# Patient Record
Sex: Female | Born: 1990 | Race: Black or African American | Hispanic: No | Smoking: Current every day smoker
Health system: Southern US, Community
[De-identification: ages and names within clinical notes are randomized; demographics above are authoritative.]

## PROBLEM LIST (undated history)

## (undated) DIAGNOSIS — J45909 Unspecified asthma, uncomplicated: Secondary | ICD-10-CM

## (undated) DIAGNOSIS — F419 Anxiety disorder, unspecified: Secondary | ICD-10-CM

## (undated) DIAGNOSIS — O009 Unspecified ectopic pregnancy without intrauterine pregnancy: Secondary | ICD-10-CM

## (undated) HISTORY — PX: ECTOPIC PREGNANCY SURGERY: SHX613

## (undated) HISTORY — DX: Anxiety disorder, unspecified: F41.9

---

## 2011-11-20 ENCOUNTER — Emergency Department: Payer: Self-pay | Admitting: Emergency Medicine

## 2011-12-31 ENCOUNTER — Emergency Department: Payer: Self-pay | Admitting: Emergency Medicine

## 2011-12-31 LAB — CBC
HCT: 38.1 % (ref 35.0–47.0)
HGB: 12.9 g/dL (ref 12.0–16.0)
MCH: 26.7 pg (ref 26.0–34.0)
MCHC: 33.7 g/dL (ref 32.0–36.0)
MCV: 79 fL — ABNORMAL LOW (ref 80–100)
RBC: 4.82 10*6/uL (ref 3.80–5.20)
RDW: 15.6 % — ABNORMAL HIGH (ref 11.5–14.5)

## 2011-12-31 LAB — URINALYSIS, COMPLETE
Bacteria: NONE SEEN
Bilirubin,UR: NEGATIVE
Glucose,UR: NEGATIVE mg/dL (ref 0–75)
Leukocyte Esterase: NEGATIVE
Nitrite: NEGATIVE
Specific Gravity: 1.002 (ref 1.003–1.030)
Squamous Epithelial: 1
WBC UR: 11 /HPF (ref 0–5)

## 2011-12-31 LAB — COMPREHENSIVE METABOLIC PANEL
Albumin: 3.8 g/dL (ref 3.4–5.0)
Anion Gap: 12 (ref 7–16)
Calcium, Total: 9 mg/dL (ref 8.5–10.1)
Chloride: 106 mmol/L (ref 98–107)
EGFR (African American): >60
Glucose: 74 mg/dL (ref 65–99)
Potassium: 3.7 mmol/L (ref 3.5–5.1)
SGOT(AST): 25 U/L (ref 15–37)
SGPT (ALT): 20 U/L (ref 12–78)

## 2011-12-31 LAB — LIPASE, BLOOD: Lipase: 105 U/L (ref 73–393)

## 2011-12-31 LAB — HCG, QUANTITATIVE, PREGNANCY: Beta Hcg, Quant.: 1578 m[IU]/mL — ABNORMAL HIGH

## 2011-12-31 LAB — WET PREP, GENITAL

## 2012-01-04 ENCOUNTER — Emergency Department: Payer: Self-pay | Admitting: Emergency Medicine

## 2012-01-04 LAB — HCG, QUANTITATIVE, PREGNANCY: Beta Hcg, Quant.: 1604 m[IU]/mL — ABNORMAL HIGH

## 2012-01-11 LAB — COMPREHENSIVE METABOLIC PANEL
Albumin: 4 g/dL (ref 3.4–5.0)
Alkaline Phosphatase: 85 U/L (ref 50–136)
BUN: 7 mg/dL (ref 7–18)
Bilirubin,Total: 0.3 mg/dL (ref 0.2–1.0)
Chloride: 105 mmol/L (ref 98–107)
Creatinine: 0.81 mg/dL (ref 0.60–1.30)
EGFR (Non-African Amer.): >60
Glucose: 131 mg/dL — ABNORMAL HIGH (ref 65–99)
Osmolality: 279 (ref 275–301)
SGPT (ALT): 19 U/L (ref 12–78)
Sodium: 140 mmol/L (ref 136–145)

## 2012-01-11 LAB — CBC
HCT: 38.4 % (ref 35.0–47.0)
HGB: 12.8 g/dL (ref 12.0–16.0)
MCH: 27.1 pg (ref 26.0–34.0)
MCHC: 33.3 g/dL (ref 32.0–36.0)

## 2012-01-11 LAB — URINALYSIS, COMPLETE
Bacteria: NONE SEEN
Glucose,UR: NEGATIVE mg/dL (ref 0–75)
Leukocyte Esterase: NEGATIVE
Nitrite: NEGATIVE
Protein: NEGATIVE
Squamous Epithelial: 2
WBC UR: 1 /HPF (ref 0–5)

## 2012-01-11 LAB — LIPASE, BLOOD: Lipase: 119 U/L (ref 73–393)

## 2012-01-12 ENCOUNTER — Observation Stay: Payer: Self-pay | Admitting: Obstetrics and Gynecology

## 2012-01-17 LAB — PATHOLOGY REPORT

## 2012-12-09 ENCOUNTER — Emergency Department: Payer: Self-pay | Admitting: Emergency Medicine

## 2013-04-22 ENCOUNTER — Emergency Department: Payer: Self-pay | Admitting: Emergency Medicine

## 2013-12-06 IMAGING — US US OB < 14 WEEKS - US OB TV
1 series · 14 of 28 positions shown · non-contrast
Comparison: none

REASON FOR EXAM: RLQ pain, vaginal bleeding, +hCG
COMMENTS:

PROCEDURE:     US  - US OB LESS THAN 14 WEEKS/W TRANS  - December 31, 2011 [DATE]
RESULT:     Comparison: None
TECHNIQUE: Multiple transabdominal gray-scale images and endovaginal
gray-scale images of the pelvis performed.

[Series 1: us ob < 14 weeks - us ob tv · 0.18mm/px · 14 of 82 slices shown]
[im 4/82]
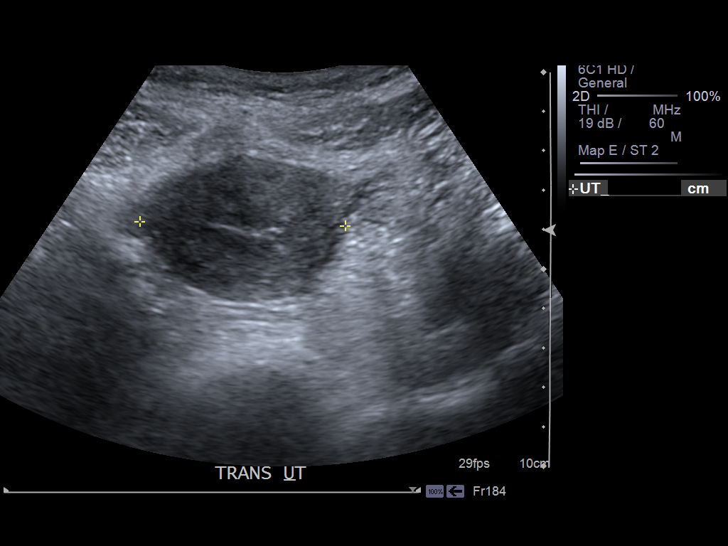
[im 10/82]
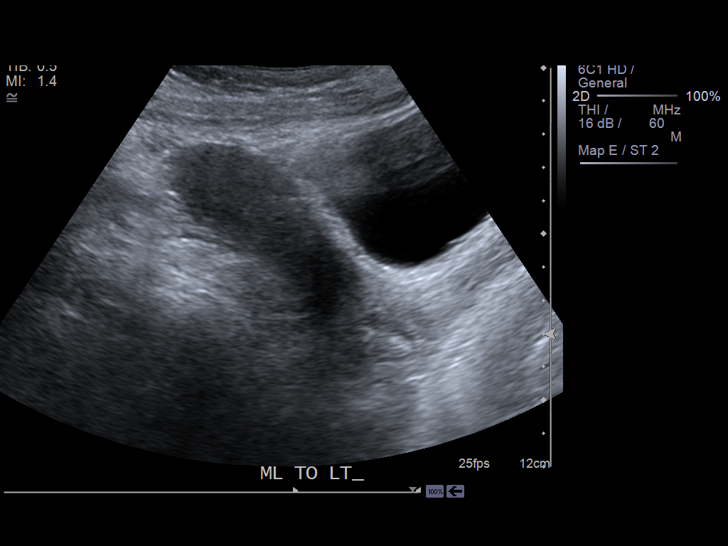
[im 16/82]
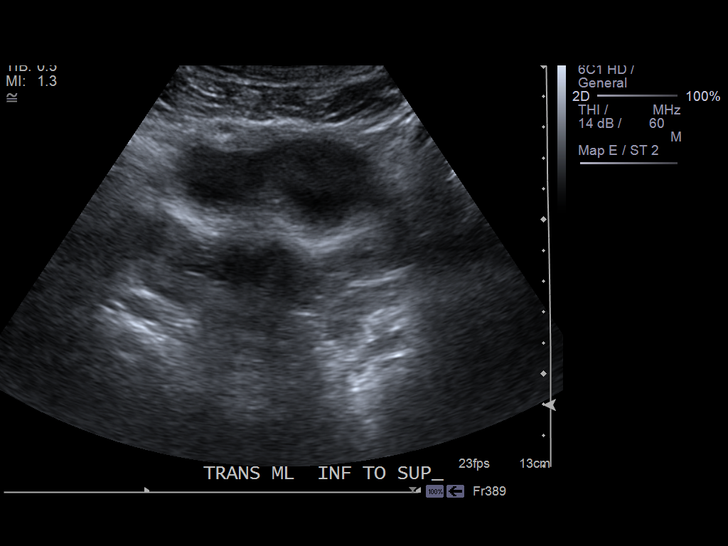
[im 22/82]
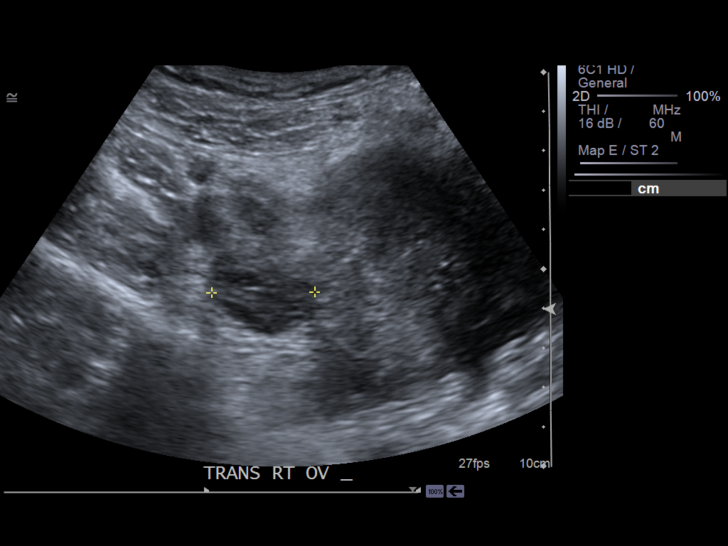
[im 28/82]
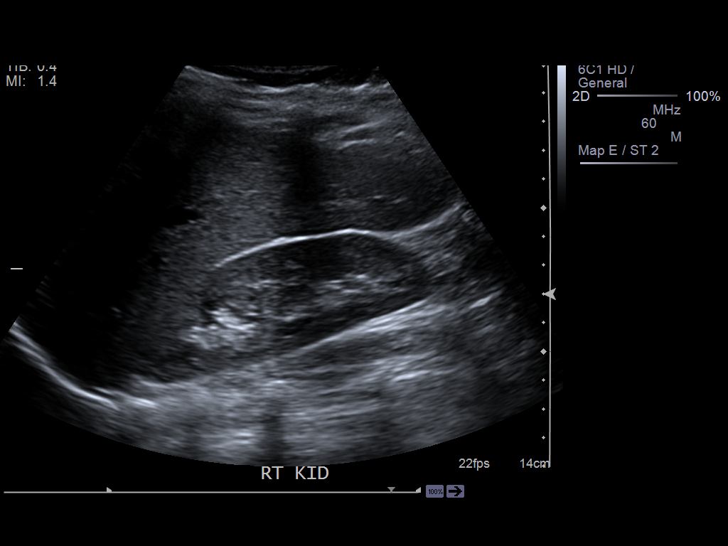
[im 34/82]
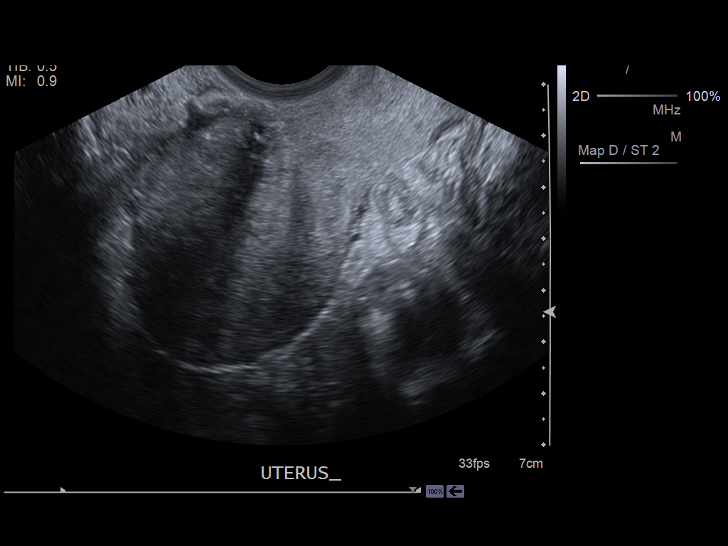
[im 40/82]
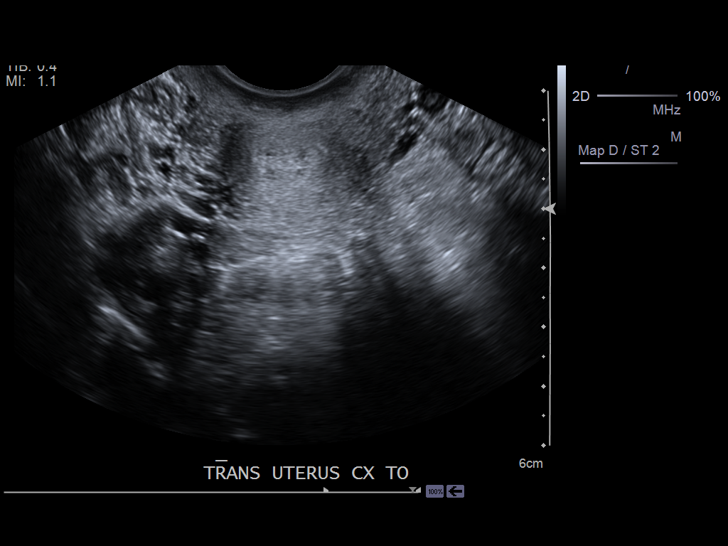
[im 46/82]
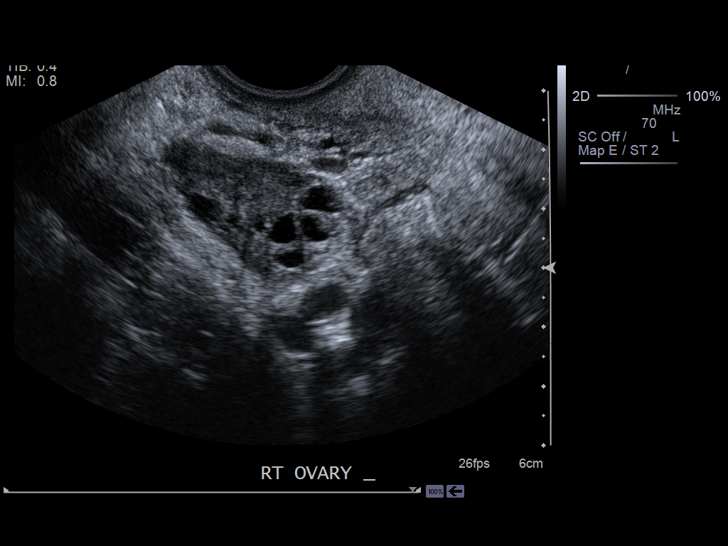
[im 52/82]
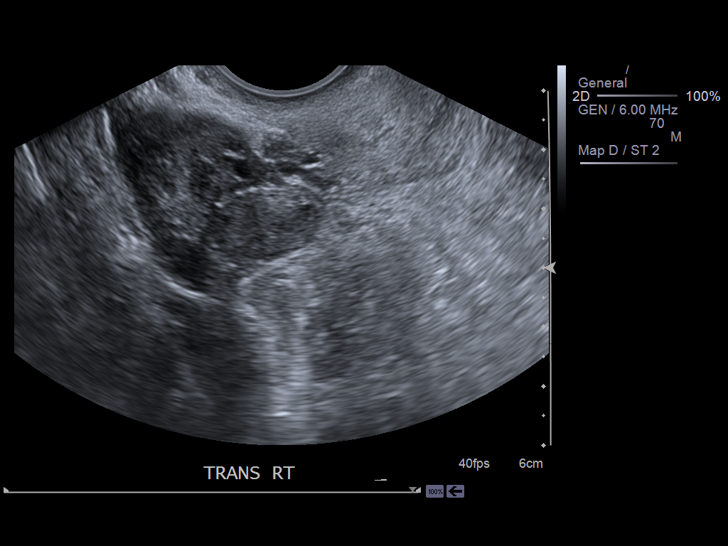
[im 58/82]
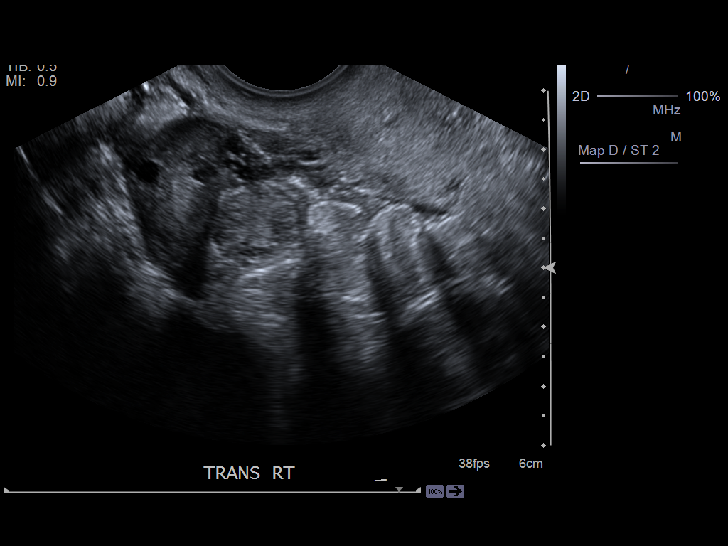
[im 64/82]
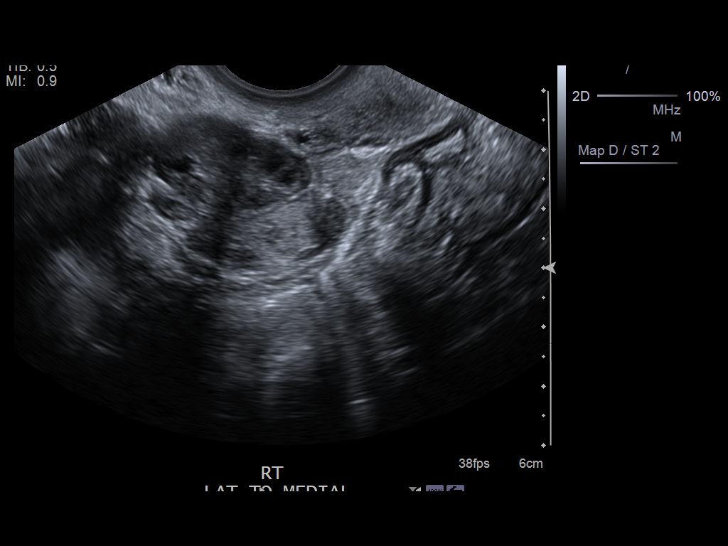
[im 70/82]
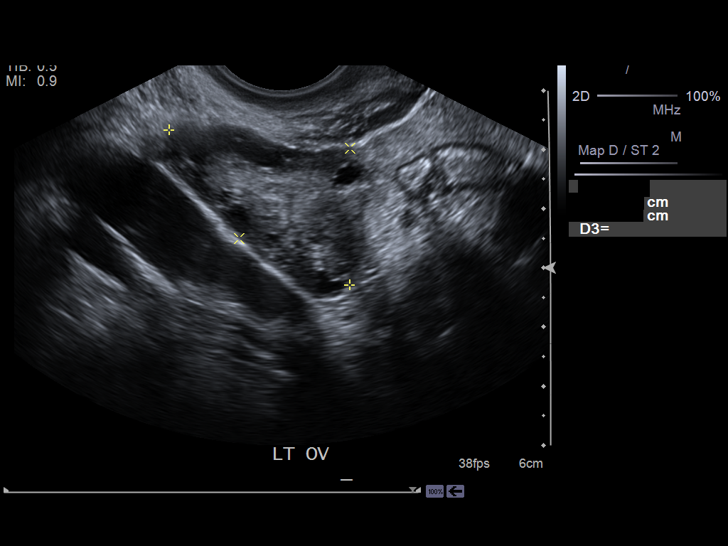
[im 76/82]
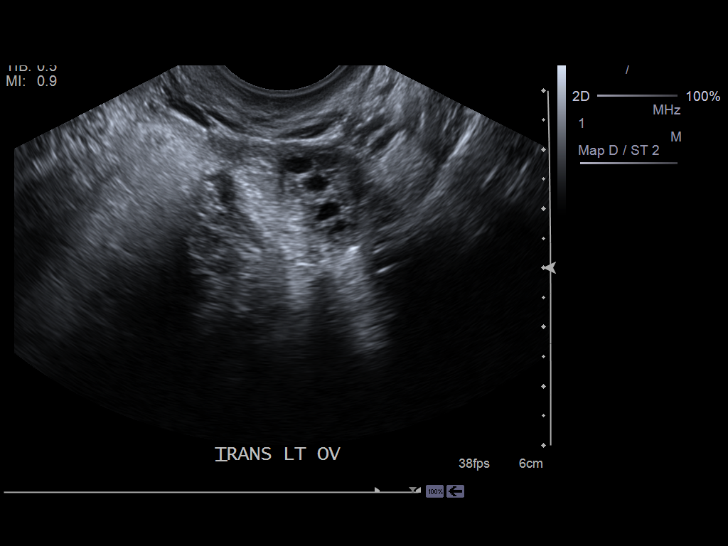
[im 82/82]
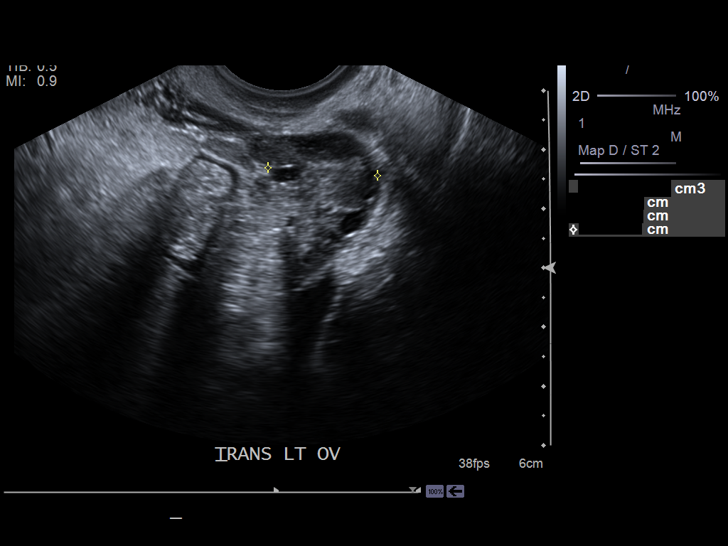

[14 of 28 positions shown; findings below may reference images not displayed]

FINDINGS: No intrauterine pregnancy is identified. The endometrium measures 8.8 mm in
thickness.

The right ovary measures 4 x 2.8 x 2.1 cm.  The left ovary measures 4 x
x 2.4 cm. There is a right adnexal mass measuring 2.6 x 1.8 x 1 cm.

There is a moderate amount of pelvic free fluid with debris within it.
IMPRESSION: No intrauterine pregnancy is identified. Differential diagnosis includes a
pregnancy too early to detect versus missed abortion versus ectopic
pregnancy. There is a hypoechoic right adnexal mass measuring 2.6 cm which
may represent a corpus luteum cyst versus ectopic pregnancy. There is mildly
complex fluid in the cul-de-sac. These findings raise the concern for an
ectopic pregnancy. Recommend clinical correlation, serial quantitative beta
HCGs, ectopic precautions, and followup ultrasound as clinically indicated.

[REDACTED]

## 2013-12-10 IMAGING — US US OB < 14 WEEKS - US OB TV
1 series · 13 of 28 positions shown · non-contrast
Comparison: none

REASON FOR EXAM: repeat ultrasound
COMMENTS:

PROCEDURE:     US  - US OB LESS THAN 14 WEEKS/W TRANS  - January 04, 2012 [DATE]
RESULT:      Comparison: None
TECHNIQUE: Multiple transabdominal gray-scale images and endovaginal
gray-scale images of the pelvis performed.

[Series 1: us ob < 14 weeks - us ob tv · 0.20mm/px · 13 of 61 slices shown]
[im 3/61]
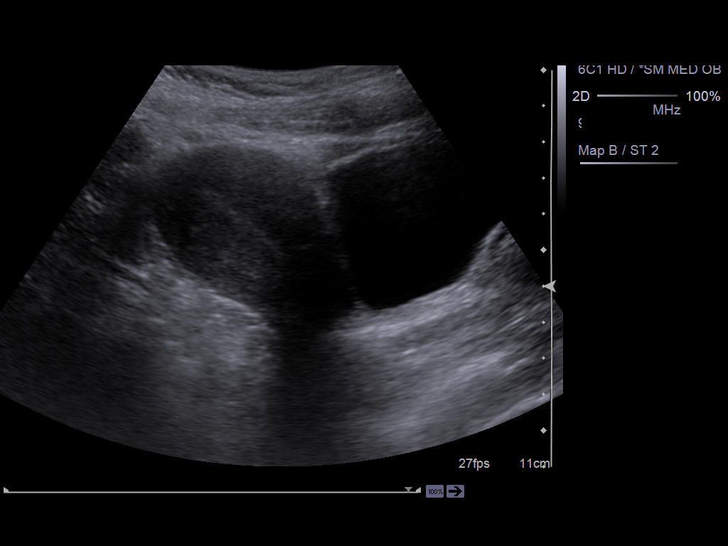
[im 7/61]
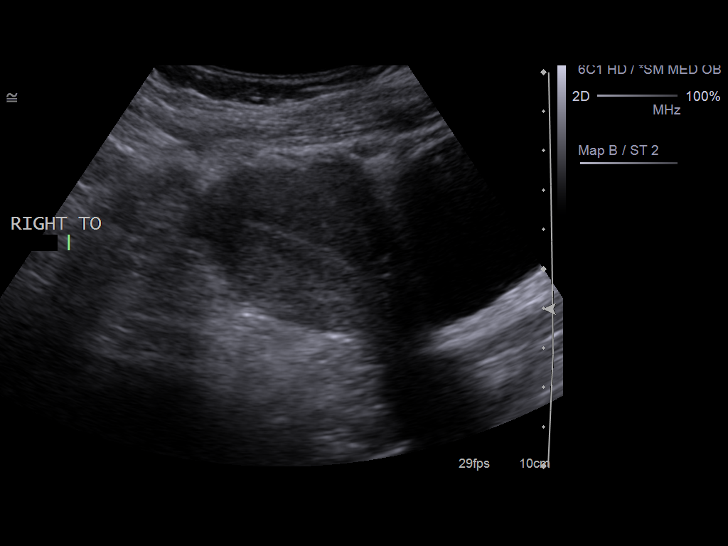
[im 12/61]
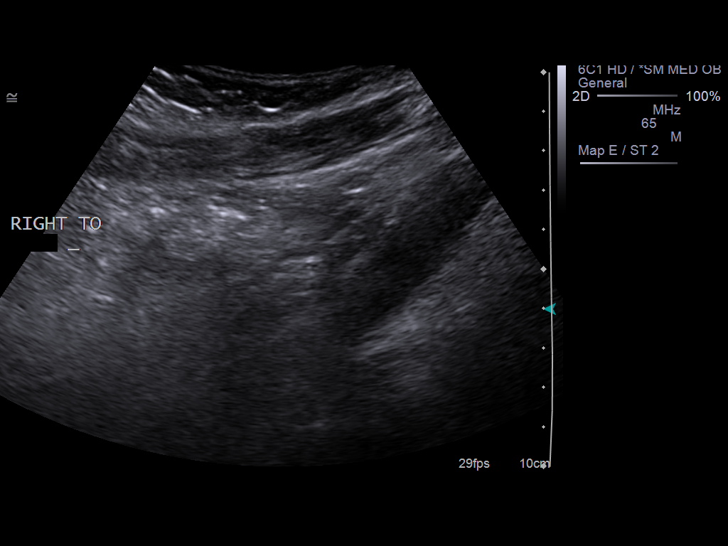
[im 16/61]
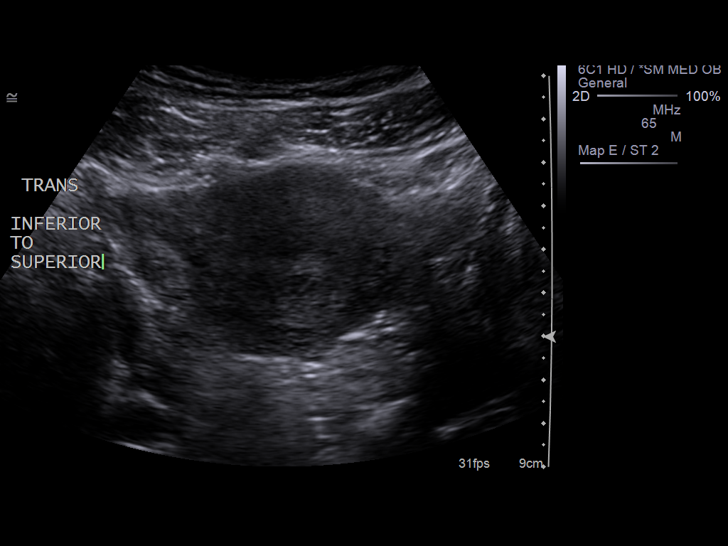
[im 21/61]
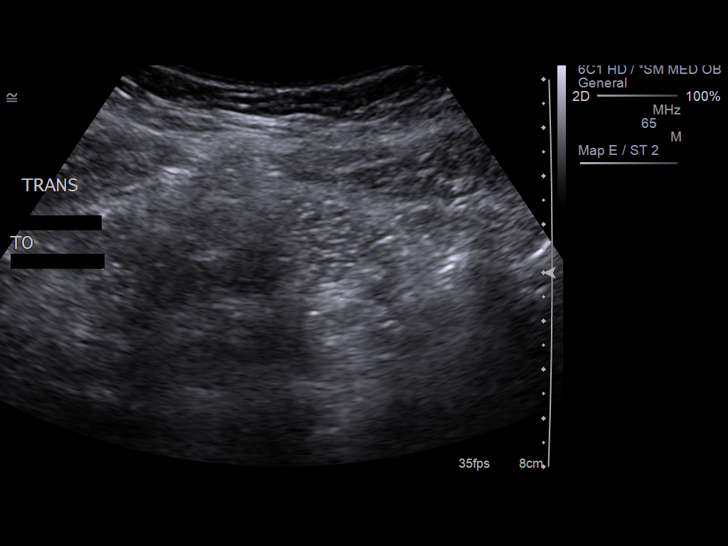
[im 25/61]
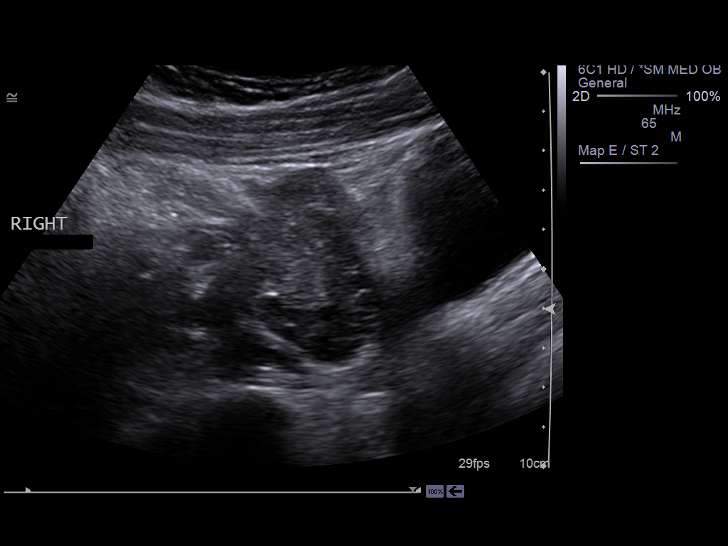
[im 32/61]
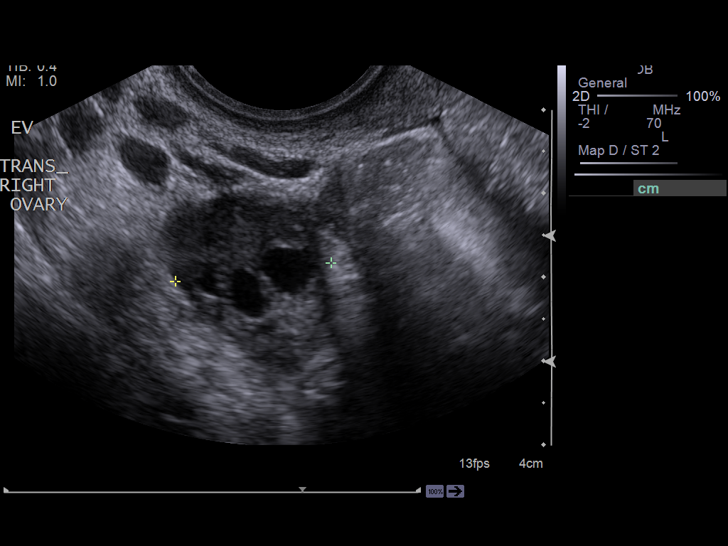
[im 36/61]
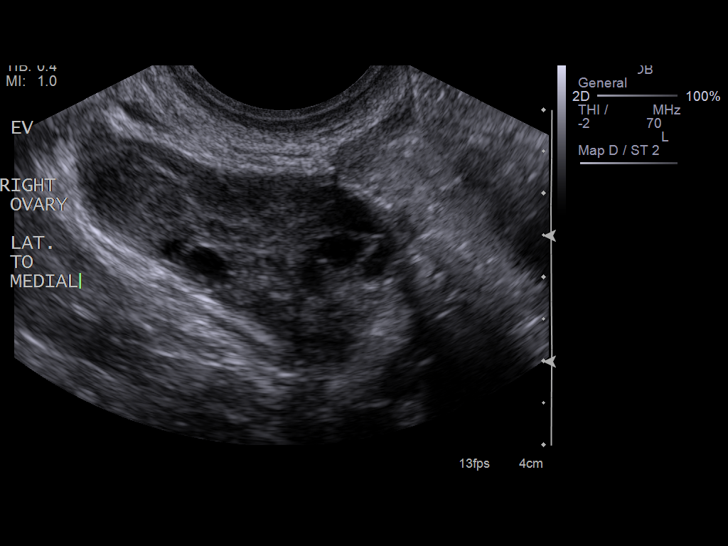
[im 41/61]
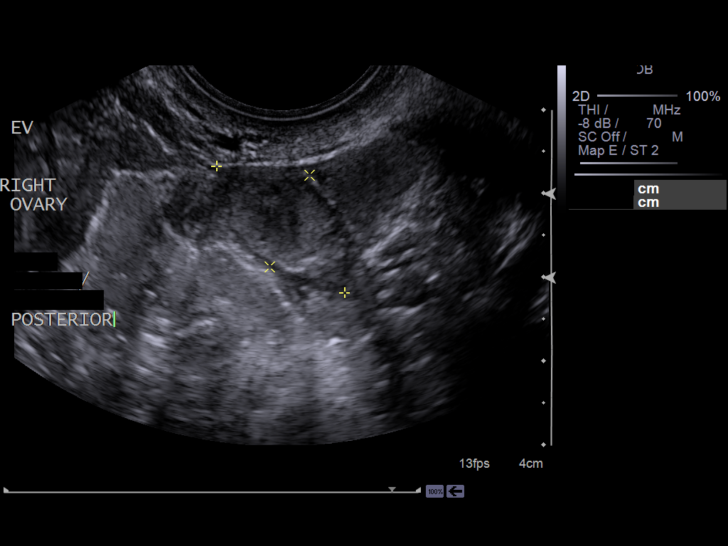
[im 45/61]
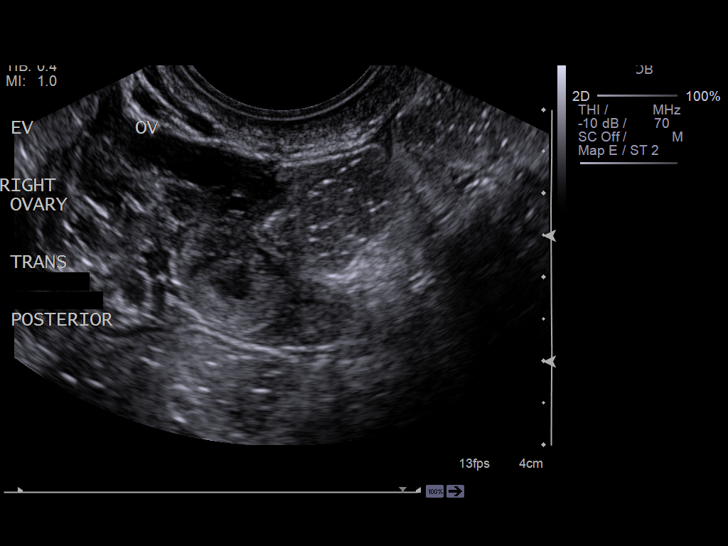
[im 49/61]
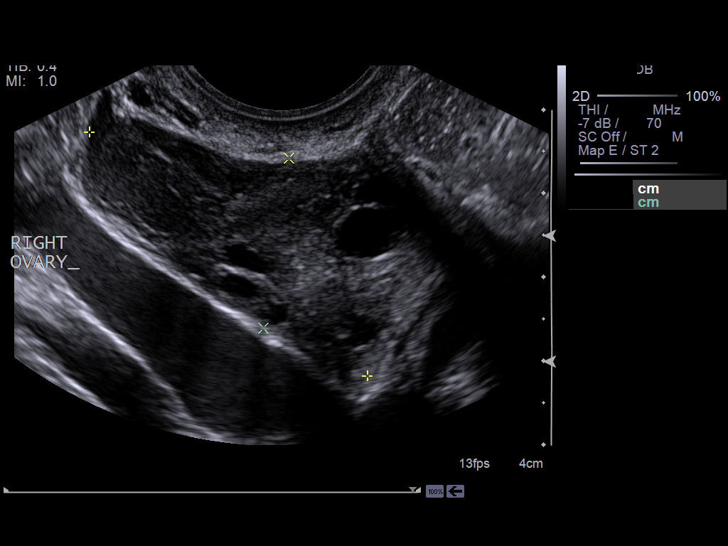
[im 54/61]
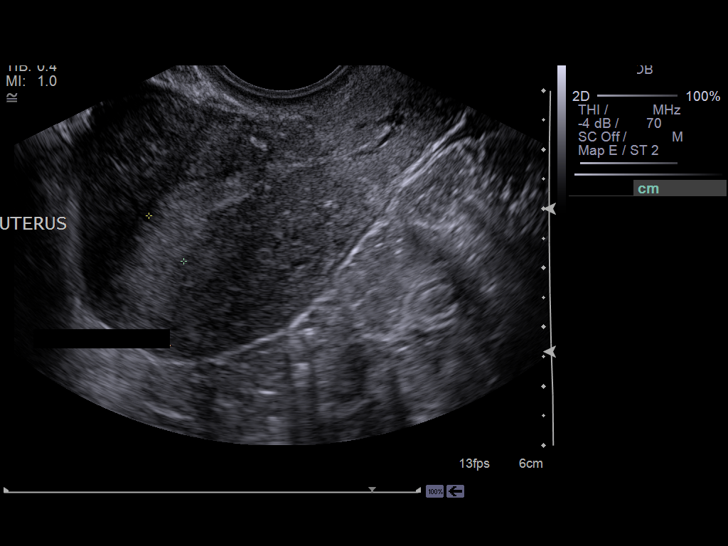
[im 58/61]
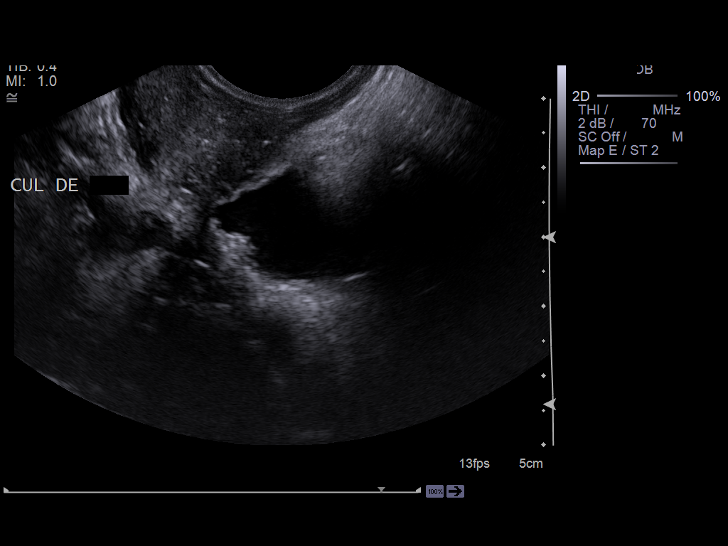

[13 of 28 positions shown; findings below may reference images not displayed]

FINDINGS: No intrauterine pregnancy is identified. The endometrium measures 9.7 mm in
thickness.

The right ovary measures 4.4 x 2.1 x 1.9 cm. The left ovary measures 4 x
x 2.4 cm. There is a right adnexal mass measuring 4.3 x 2.1 x 2.2 cm. There
is 2.2 x 1.2 x 1.4 cm right adnexal mass.

There is a moderate amount of pelvic free fluid with debris within it.
IMPRESSION: No intrauterine pregnancy is identified. Differential diagnosis includes a
pregnancy too early to detect versus missed abortion versus ectopic
pregnancy. There is a hypoechoic right adnexal mass measuring 2.2cm which
may represent a corpus luteum cyst versus ectopic pregnancy. There is mildly
complex fluid in the cul-de-sac. These findings raise the concern for an
ectopic pregnancy. Recommend clinical correlation, serial quantitative beta
HCGs, ectopic precautions, and followup ultrasound as clinically indicated.

[REDACTED]

## 2014-04-23 ENCOUNTER — Ambulatory Visit: Payer: Self-pay | Admitting: Obstetrics and Gynecology

## 2014-04-23 LAB — CBC
HCT: 39.4 % (ref 35.0–47.0)
HGB: 12.3 g/dL (ref 12.0–16.0)
MCH: 24.1 pg — AB (ref 26.0–34.0)
MCHC: 31.2 g/dL — AB (ref 32.0–36.0)
MCV: 77 fL — AB (ref 80–100)
Platelet: 123 10*3/uL — ABNORMAL LOW (ref 150–440)
RBC: 5.11 10*6/uL (ref 3.80–5.20)
RDW: 16.7 % — AB (ref 11.5–14.5)
WBC: 6.3 10*3/uL (ref 3.6–11.0)

## 2014-04-23 LAB — TSH: THYROID STIMULATING HORM: 0.704 u[IU]/mL

## 2014-04-27 ENCOUNTER — Ambulatory Visit: Payer: Self-pay | Admitting: Obstetrics and Gynecology

## 2014-07-05 ENCOUNTER — Emergency Department
Admit: 2014-07-05 | Disposition: A | Discharge status: Home / Self Care | Payer: Self-pay | Admitting: Physician Assistant

## 2014-07-21 NOTE — Op Note (Signed)
PATIENT NAME:  Heather [name], Heather [name] MR#:  [id number] DATE OF BIRTH:  February 20, 1991  DATE OF PROCEDURE:  01/12/2012  PREOPERATIVE DIAGNOSIS: Ectopic pregnancy.  POSTOPERATIVE DIAGNOSIS: Ruptured right fallopian tube and ectopic pregnancy with significant intraabdominal adhesive disease.  PROCEDURE: Laparoscopic lysis of adhesions and right salpingectomy.   SURGEON: Vena AustriaAndreas Rasheeda Mulvehill, MD  ANESTHESIA: General.   ESTIMATED BLOOD LOSS: 25 mL.  OPERATIVE FLUIDS: 1 liter of crystalloid and 50 mL of urine, straight catheter prior to beginning of the case.   COMPLICATIONS: None.   DRAINS OR TUBES: None.   IMPLANTS: None.   SPECIMENS REMOVED: Right tube containing ectopic pregnancy.   INTRAOPERATIVE FINDINGS: There was a significant amount of adhesive disease involving the omentum, anterior abdominal wall, right uterine fundus and cornea. These were taken down using a 5 mm LigaSure device. There was approximately 100 mL of hemoperitoneum noted upon entry into the abdomen. The right tube contained an ectopic pregnancy with active bleeding from the fimbria. The ectopic appeared to be involving the ampulla of the right tube. The left tube and ovary appeared grossly normal. The right ovary also appeared normal.   PATIENT CONDITION FOLLOWING PROCEDURE: Stable.   PROCEDURE IN DETAIL: Risks, benefits, and alternatives of the procedure were discussed with the patient prior to proceeding with the case. The patient had previously received methotrexate on 01/09/2012 without improvement in symptoms and actually worsening abdominal pain prompting her presentation to the Emergency Room. Ultrasound obtained in the Emergency Room revealed the previously noted right adnexal mass had increased in size with complex fluid in the pelvis suggestive of hemoperitoneum. The patient was taken back to the Operating Room were general anesthesia was administered. She was positioned in the dorsal lithotomy position using yellow-fin  stirrups. The patient's bladder was straight-catheterized and a sterile speculum was placed. The anterior lip of the cervix was visualized, grasped with a single-tooth tenaculum, and a Hulka tenaculum was then placed. Following this attention was turned to the patient's abdomen. The umbilicus was infiltrated with 1% lidocaine and a stab incision was made at the base of the umbilicus. A 5 mm XL trocar was used to gain entry into the abdomen under direct visualization. Once placement in the abdomen was confirmed, pneumoperitoneum was established. Two assistant ports, one 11 mm left lateral and one 5 mm right lateral, were placed under direct visualization. Significant adhesive disease was noted in the midline of the abdomen extending down to the uterine fundus involving the right cornea. This was mainly omentum. This was taken down using a 5 mm LigaSure device. Upon lysis of adhesions, the remainder of the abdomen was inspected and noted to be grossly normal, except for approximately 100 mL of hemoperitoneum. The left tube and ovary appeared grossly normal. The right tube contained an ectopic pregnancy, in the ampullary portion of the tube. The right ovary was normal. The right tube was removed using serial bites of the 5 mm LigaSure device. Upon transection of the specimen, it was placed into an Endo Catch bag and removed through the 11 mm port site. Pedicles were inspected and noted to be hemostatic, all trocars were removed under direct visualization and pneumoperitoneum was evacuated. The 11 mm port site was closed using a #1 Vicryl on a GU needle. The skin was closed, on the 11 mm port site, using 4-0 Monocryl in subcuticular fashion. Each port site was then dressed with Dermabond. Sponge, needle, and instrument counts were correct x2. The patient tolerated the procedure well and was taken to the  recovery room in stable condition. ____________________________ Florina Ou. Bonney Aid, MD ams:slb D: 01/16/2012  13:10:52 ET     T: 01/16/2012 13:30:34 ET        JOB#: [id number] cc: Florina Ou. Bonney Aid, MD, <Dictator> [place] Cathrine Muster MD ELECTRONICALLY SIGNED 01/22/2012 21:35
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
## 2014-07-27 LAB — SURGICAL PATHOLOGY

## 2014-08-02 NOTE — Op Note (Signed)
PATIENT NAME:  Heather [name], Heather [name] MR#:  [id number] DATE OF BIRTH:  [birth year]  DATE OF PROCEDURE:  04/27/2014  PREOPERATIVE DIAGNOSES: Positive ECC, cells suspicious for HGSIL.   POSTOPERATIVE DIAGNOSES: Positive ECC, cells suspicious for HGSIL.   OPERATIVE PROCEDURE: Loop electrosurgical excision procedure cone biopsy.   SURGEON: Prentice DockerMartin A. Marieme Mcmackin, MD  FIRST ASSISTANT: Mare LoanJan Beard, PA-S.   ANESTHESIA: General.   INDICATIONS: The patient is a 24 year old African American female para 1-0-2-1, who presents for surgical management of endocervical dysplasia. Colposcopy demonstrated no ectocervical disease. 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   FINDINGS AT SURGERY: Included non-staining area of Lugol's solution at 1:00.   DESCRIPTION OF PROCEDURE: The patient was brought to the operating room where she was placed in the supine position. General anesthesia was induced without difficulty. She was placed in dorsal lithotomy position using the candy cane stirrups. A Betadine external prep and drape was performed in standard fashion. A coated speculum was placed into the vagina and the vagina was painted with Lugol solution. Non-staining area of Lugol's was noted at 1:00. Cervix was circumferentially infiltrated with 1% lidocaine with 1:100,000 epinephrine in standard fashion. Approximately 15 mL were utilized. The ectocervical LEEP was then performed with a wide loop. The endocervical LEEP was performed with a narrow loop. The post LEEP ECC was performed with a serrated curette. Rollerball cautery was used on the cone bed to help facilitate hemostasis. Astringent was applied to the cone bed. The patient's bladder was then drained with a red Robinson catheter producing 250 mL of yellow urine. The patient was then awakened, mobilized, and taken to the recovery room in satisfactory condition.   ESTIMATED BLOOD LOSS: Minimal.   COMPLICATIONS: None.    All instruments, needle, and sponge  counts were verified as correct.     ____________________________ Prentice DockerMartin A. Blayden Conwell, MD mad:ap D: 04/27/2014 10:48:36 ET T: 04/27/2014 11:28:08 ET JOB#: [id number]  cc: Daphine DeutscherMartin A. Lovell Roe, MD, <Dictator> [facility] Prentice DockerMARTIN A Audie Wieser MD ELECTRONICALLY SIGNED 04/29/2014 1:20

## 2014-08-11 NOTE — H&P (Signed)
L&D Evaluation:  History:  HPI [id number], on OCP's for Contraception presents for Leep Cone Biopsy for +ECC suspicious for HGSIL   Patient's Medical History No Chronic Illness   Patient's Surgical History Previous C-Section  Rt Salpingectomy   Medications Chantix, Claritin   Social History tobacco   Family History Non-Contributory   ROS:  ROS All systems were reviewed.  HEENT, CNS, GI, GU, Respiratory, CV, Renal and Musculoskeletal systems were found to be normal.   Exam:  Vital Signs stable   General no apparent distress   Mental Status clear   Chest clear   Heart normal sinus rhythm   Back no CVAT   Edema no edema   Pelvic Deferred to OR   Skin dry   Lymph no lymphadenopathy   Impression:  Impression +ECC for HGSIL (suspicion); Colpo negative   Plan:  Plan LEEP Procedure   Comments Pre Op Counseling: Full counseling done. Pt accepts all risks of surgery including bleeding/infection/anesthesia risks/etc.All questions addressed. Consent given.   SEE FAXED H&P FOR DETAILS.   Electronic Signatures: Toniesha Zellner, Prentice DockerMartin A (MD)  (Signed 25-Jan-16 08:57)  Authored: L&D Evaluation   Last Updated: 25-Jan-16 08:57 by Cindy Brindisi, Prentice DockerMartin A (MD)

## 2016-06-23 ENCOUNTER — Ambulatory Visit
Admission: EM | Admit: 2016-06-23 | Discharge: 2016-06-23 | Disposition: A | Discharge status: Home / Self Care | Payer: Self-pay | Attending: Family Medicine | Admitting: Family Medicine

## 2016-06-23 DIAGNOSIS — J209 Acute bronchitis, unspecified: Secondary | ICD-10-CM

## 2016-06-23 DIAGNOSIS — R059 Cough, unspecified: Secondary | ICD-10-CM

## 2016-06-23 DIAGNOSIS — R05 Cough: Secondary | ICD-10-CM

## 2016-06-23 MED ORDER — HYDROCOD POLST-CPM POLST ER 10-8 MG/5ML PO SUER: 5.0000 mL | Freq: Two times a day (BID) | ORAL | 0 refills | Status: DC | PRN

## 2016-06-23 MED ORDER — ALBUTEROL SULFATE HFA 108 (90 BASE) MCG/ACT IN AERS: 2.0000 | INHALATION_SPRAY | RESPIRATORY_TRACT | 0 refills | Status: AC | PRN

## 2016-06-23 MED ORDER — AZITHROMYCIN 250 MG PO TABS: ORAL_TABLET | ORAL | 0 refills | Status: DC

## 2016-06-23 NOTE — ED Triage Notes (Signed)
Pt reports 2 weeks of cough, worse at night. Denies other sx currently. Denies pain. Denies fever. Reports the last time she had this, she needed an inhaler. Asking for another inhaler.

## 2016-06-23 NOTE — ED Provider Notes (Signed)
[facility]    CSN: [id number] Arrival date & time: 06/23/16  1329     History   Chief Complaint Chief Complaint  Patient presents with  . Cough    HPI Heather [name] is a 26 y.o. female.   Patient is a 26 year old black female who has had episodes of bronchospasm and wheezing off and on over the last 2 weeks.. She reports using albuterol inhaler before and feels that she needs an inhaler now. She reports coughing up yellowish-green sputum at times and proximal spasms as well especially at night. Past smoker history she's had an ectopic pregnancy surgery. She still smokes. No known drug allergies. No pertinent family medical history.   The history is provided by the patient. No language interpreter was used.  Cough  Cough characteristics:  Productive and non-productive Sputum characteristics:  Yellow and green Severity:  Moderate Duration:  2 weeks Timing:  Intermittent Progression:  Worsening Chronicity:  New Smoker: yes   Context: upper respiratory infection   Relieved by:  Nothing Worsened by:  Nothing Ineffective treatments:  None tried Associated symptoms: shortness of breath and wheezing   Associated symptoms: no chills, no diaphoresis and no fever     History reviewed. No pertinent past medical history.  There are no active problems to display for this patient.   Past Surgical History:  Procedure Laterality Date  . ECTOPIC PREGNANCY SURGERY      OB History    No data available       Home Medications    Prior to Admission medications   Medication Sig Start Date End Date Taking? Authorizing Provider  guaiFENesin (MUCINEX) 600 MG 12 hr tablet Take by mouth 2 (two) times daily.   Yes Historical Provider, MD  albuterol (PROVENTIL HFA;VENTOLIN HFA) 108 (90 Base) MCG/ACT inhaler Inhale 2 puffs into the lungs every 4 (four) hours as needed for wheezing or shortness of breath. 06/23/16   Hassan Rowan, MD  azithromycin (ZITHROMAX Z-PAK) 250 MG  tablet Take 2 tablets first day and then 1 po a day for 4 days 06/23/16   Hassan Rowan, MD  chlorpheniramine-HYDROcodone [facility] PENNKINETIC ER) 10-8 MG/5ML SUER Take 5 mLs by mouth every 12 (twelve) hours as needed. 06/23/16   Hassan Rowan, MD    Family History History reviewed. No pertinent family history.  Social History Social History  Substance Use Topics  . Smoking status: Current Every Day Smoker    Types: Cigars  . Smokeless tobacco: Never Used  . Alcohol use Yes     Comment: social     Allergies   Patient has no known allergies.   Review of Systems Review of Systems  Constitutional: Negative for chills, diaphoresis and fever.  Respiratory: Positive for cough, shortness of breath and wheezing.   All other systems reviewed and are negative.    Physical Exam Triage Vital Signs ED Triage Vitals  Enc Vitals Group     BP 06/23/16 1344 123/72     Pulse Rate 06/23/16 1344 82     Resp 06/23/16 1344 16     Temp 06/23/16 1344 98.8 F (37.1 C)     Temp Source 06/23/16 1344 Oral     SpO2 06/23/16 1344 100 %     Weight 06/23/16 1343 150 lb (68 kg)     Height 06/23/16 1343 5\' 2"  (1.575 m)     Head Circumference --      Peak Flow --      Pain Score --  Pain Loc --      Pain Edu? --      Excl. in GC? --    No data found.   Updated Vital Signs BP 123/72 (BP Location: Right Arm)   Pulse 82   Temp 98.8 F (37.1 C) (Oral)   Resp 16   Ht 5\' 2"  (1.575 m)   Wt 150 lb (68 kg)   LMP 06/22/2016   SpO2 100%   BMI 27.44 kg/m   Visual Acuity Right Eye Distance:   Left Eye Distance:   Bilateral Distance:    Right Eye Near:   Left Eye Near:    Bilateral Near:     Physical Exam  Constitutional: She appears well-developed and well-nourished. No distress.  HENT:  Head: Normocephalic and atraumatic.  Right Ear: External ear normal.  Left Ear: External ear normal.  Nose: Nose normal.  Mouth/Throat: Oropharynx is clear and moist.  Eyes: Pupils are equal,  round, and reactive to light.  Neck: Normal range of motion.  Cardiovascular: Normal rate and regular rhythm.   Pulmonary/Chest: Effort normal and breath sounds normal.  Musculoskeletal: Normal range of motion.  Lymphadenopathy:    She has cervical adenopathy.  Neurological: She is alert.  Skin: Skin is warm. She is not diaphoretic.  Psychiatric: She has a normal mood and affect.  Vitals reviewed.    UC Treatments / Results  Labs (all labs ordered are listed, but only abnormal results are displayed) Labs Reviewed - No data to display  EKG  EKG Interpretation None       Radiology No results found.  Procedures Procedures (including critical care time)  Medications Ordered in UC Medications - No data to display   Initial Impression / Assessment and Plan / UC Course  I have reviewed the triage vital signs and the nursing notes.  Pertinent labs & imaging results that were available during my care of the patient were reviewed by me and considered in my medical decision making (see chart for details).     Since the history is shortness breath for 2 weeks she used inhaler before we'll renew her albuterol inhaler Z-Pak test next 1 teaspoon twice a day when necessary for cough follow-up PCP in a week if not better. She declined a work note.  Final Clinical Impressions(s) / UC Diagnoses   Final diagnoses:  Acute bronchitis with bronchospasm  Cough    New Prescriptions Discharge Medication List as of 06/23/2016  2:16 PM    START taking these medications   Details  albuterol (PROVENTIL HFA;VENTOLIN HFA) 108 (90 Base) MCG/ACT inhaler Inhale 2 puffs into the lungs every 4 (four) hours as needed for wheezing or shortness of breath., Starting Fri 06/23/2016, Normal    azithromycin (ZITHROMAX Z-PAK) 250 MG tablet Take 2 tablets first day and then 1 po a day for 4 days, Normal    chlorpheniramine-HYDROcodone (TUSSIONEX PENNKINETIC ER) 10-8 MG/5ML SUER Take 5 mLs by mouth every  12 (twelve) hours as needed., Starting Fri 06/23/2016, Normal         Note: This dictation was prepared with Dragon dictation along with smaller phrase technology. Any transcriptional errors that result from this process are unintentional.   Hassan RowanEugene Emillio Ngo, MD 06/23/16 1453

## 2016-10-20 ENCOUNTER — Telehealth: Payer: Self-pay | Admitting: Obstetrics and Gynecology

## 2016-10-20 NOTE — Telephone Encounter (Signed)
Patient received your message - please call her again

## 2016-10-26 NOTE — Telephone Encounter (Signed)
I contacted pt. She did not have much info. Advised I contact her Mother Champ MungoGiselle but mailbox full. Will try later.   Per PT they had to r[place]t [place] HSG. This form is for travel guard insurance. They would like to get reimbursed for the ticket. Per pt she could only have  hsg on 04/23/2014.

## 2016-10-27 NOTE — Telephone Encounter (Signed)
Tried to contact Mom(Heather [name]) mailbox full.

## 2016-11-09 NOTE — Telephone Encounter (Signed)
Pts mom Giselle aware ac has signed travel guard insurance. Faxed with confirmation and left up front for p/u.

## 2019-05-16 ENCOUNTER — Ambulatory Visit: Payer: Medicaid Other | Attending: Internal Medicine

## 2019-05-16 DIAGNOSIS — Z20822 Contact with and (suspected) exposure to covid-19: Secondary | ICD-10-CM

## 2019-05-17 LAB — NOVEL CORONAVIRUS, NAA: SARS-CoV-2, NAA: NOT DETECTED

## 2019-08-11 ENCOUNTER — Encounter: Payer: Self-pay | Admitting: Certified Nurse Midwife

## 2019-08-12 ENCOUNTER — Telehealth: Payer: Self-pay | Admitting: Certified Nurse Midwife

## 2019-08-12 NOTE — Telephone Encounter (Signed)
Patient called in to reschedule her appointment that she had to cancelled on May 10th at 2:15pm. Went to reschedule patient for NEW GYN visit type but [facility] didn't have any avalibility until the first week of June. Patient said that she didn't understand why she was being considered a new patient as she has been seen at our facility before, informed patient that she hadn't been seen in over 3 years so we would consider her a NEW GYN. Before I had the chance to offer another provider or even ask JML if we could work her in she hung up on me.

## 2019-08-28 ENCOUNTER — Ambulatory Visit: Payer: 59 | Admitting: Obstetrics and Gynecology

## 2019-09-18 ENCOUNTER — Telehealth: Payer: Self-pay | Admitting: Obstetrics and Gynecology

## 2019-09-18 ENCOUNTER — Encounter: Payer: Medicaid Other | Admitting: Obstetrics and Gynecology

## 2019-09-18 NOTE — Telephone Encounter (Signed)
Pt got here at 2:43 for her appt. I told the pt that she was late and we have to call back to see if they are still going to see her. I told the pt to have a seat and I called back to the nurse. The nurse stated that the pt is almost 15 mins past her appt and we will have to reschedule. I called the pt back to the window and that the pt what the nurse stated that we will have to reschdule. The pt seemed okay and stated that she is off next Friday. I didn't have any open apps for her with that provider. The pt stated she can see someone else. I then looked at all our providers to see what is the next appt open. I told her I had one on the 29th and she stated she has to work. Gave her a couple of more days that we have and she said she would just give [place] a call because every day that I said we have she has to work. I told the pt I could help her now but hse just walked out. I told her I was sorry.

## 2019-10-03 ENCOUNTER — Ambulatory Visit: Payer: 59 | Admitting: Obstetrics

## 2019-10-09 ENCOUNTER — Other Ambulatory Visit (HOSPITAL_COMMUNITY)
Admission: RE | Admit: 2019-10-09 | Discharge: 2019-10-09 | Disposition: A | Discharge status: Home / Self Care | Payer: 59 | Source: Ambulatory Visit | Attending: Advanced Practice Midwife | Admitting: Advanced Practice Midwife

## 2019-10-09 ENCOUNTER — Other Ambulatory Visit: Payer: Self-pay

## 2019-10-09 ENCOUNTER — Ambulatory Visit: Payer: 59 | Admitting: Advanced Practice Midwife

## 2019-10-09 ENCOUNTER — Encounter: Payer: Self-pay | Admitting: Advanced Practice Midwife

## 2019-10-09 VITALS — BP 118/70 | HR 81 | Ht 62.0 in | Wt 171.0 lb

## 2019-10-09 DIAGNOSIS — Z113 Encounter for screening for infections with a predominantly sexual mode of transmission: Secondary | ICD-10-CM | POA: Insufficient documentation

## 2019-10-09 DIAGNOSIS — Z124 Encounter for screening for malignant neoplasm of cervix: Secondary | ICD-10-CM | POA: Diagnosis present

## 2019-10-09 DIAGNOSIS — Z01419 Encounter for gynecological examination (general) (routine) without abnormal findings: Secondary | ICD-10-CM | POA: Diagnosis not present

## 2019-10-09 DIAGNOSIS — B009 Herpesviral infection, unspecified: Secondary | ICD-10-CM

## 2019-10-09 NOTE — Patient Instructions (Signed)
Health Maintenance, Female Adopting a healthy lifestyle and getting preventive care are important in promoting health and wellness. Ask your health care provider about:  The right schedule for you to have regular tests and exams.  Things you can do on your own to prevent diseases and keep yourself healthy. What should I know about diet, weight, and exercise? Eat a healthy diet   Eat a diet that includes plenty of vegetables, fruits, low-fat dairy products, and lean protein.  Do not eat a lot of foods that are high in solid fats, added sugars, or sodium. Maintain a healthy weight Body mass index (BMI) is used to identify weight problems. It estimates body fat based on height and weight. Your health care provider can help determine your BMI and help you achieve or maintain a healthy weight. Get regular exercise Get regular exercise. This is one of the most important things you can do for your health. Most adults should:  Exercise for at least 150 minutes each week. The exercise should increase your heart rate and make you sweat (moderate-intensity exercise).  Do strengthening exercises at least twice a week. This is in addition to the moderate-intensity exercise.  Spend less time sitting. Even light physical activity can be beneficial. Watch cholesterol and blood lipids Have your blood tested for lipids and cholesterol at 31 years of age, then have this test every 5 years. Have your cholesterol levels checked more often if:  Your lipid or cholesterol levels are high.  You are older than 31 years of age.  You are at high risk for heart disease. What should I know about cancer screening? Depending on your health history and family history, you may need to have cancer screening at various ages. This may include screening for:  Breast cancer.  Cervical cancer.  Colorectal cancer.  Skin cancer.  Lung cancer. What should I know about heart disease, diabetes, and high blood  pressure? Blood pressure and heart disease  High blood pressure causes heart disease and increases the risk of stroke. This is more likely to develop in people who have high blood pressure readings, are of African descent, or are overweight.  Have your blood pressure checked: ? Every 3-5 years if you are 18-39 years of age. ? Every year if you are 40 years old or older. Diabetes Have regular diabetes screenings. This checks your fasting blood sugar level. Have the screening done:  Once every three years after age 40 if you are at a normal weight and have a low risk for diabetes.  More often and at a younger age if you are overweight or have a high risk for diabetes. What should I know about preventing infection? Hepatitis B If you have a higher risk for hepatitis B, you should be screened for this virus. Talk with your health care provider to find out if you are at risk for hepatitis B infection. Hepatitis C Testing is recommended for:  Everyone born from 1945 through 1965.  Anyone with known risk factors for hepatitis C. Sexually transmitted infections (STIs)  Get screened for STIs, including gonorrhea and chlamydia, if: ? You are sexually active and are younger than 31 years of age. ? You are older than 31 years of age and your health care provider tells you that you are at risk for this type of infection. ? Your sexual activity has changed since you were last screened, and you are at increased risk for chlamydia or gonorrhea. Ask your health care provider if   you are at risk.  Ask your health care provider about whether you are at high risk for HIV. Your health care provider may recommend a prescription medicine to help prevent HIV infection. If you choose to take medicine to prevent HIV, you should first get tested for HIV. You should then be tested every 3 months for as long as you are taking the medicine. Pregnancy  If you are about to stop having your period (premenopausal) and  you may become pregnant, seek counseling before you get pregnant.  Take 400 to 800 micrograms (mcg) of folic acid every day if you become pregnant.  Ask for birth control (contraception) if you want to prevent pregnancy. Osteoporosis and menopause Osteoporosis is a disease in which the bones lose minerals and strength with aging. This can result in bone fractures. If you are 65 years old or older, or if you are at risk for osteoporosis and fractures, ask your health care provider if you should:  Be screened for bone loss.  Take a calcium or vitamin D supplement to lower your risk of fractures.  Be given hormone replacement therapy (HRT) to treat symptoms of menopause. Follow these instructions at home: Lifestyle  Do not use any products that contain nicotine or tobacco, such as cigarettes, e-cigarettes, and chewing tobacco. If you need help quitting, ask your health care provider.  Do not use street drugs.  Do not share needles.  Ask your health care provider for help if you need support or information about quitting drugs. Alcohol use  Do not drink alcohol if: ? Your health care provider tells you not to drink. ? You are pregnant, may be pregnant, or are planning to become pregnant.  If you drink alcohol: ? Limit how much you use to 0-1 drink a day. ? Limit intake if you are breastfeeding.  Be aware of how much alcohol is in your drink. In the [place] one drink equals one 12 oz bottle of beer (355 mL), one 5 oz glass of wine (148 mL), or one 1 oz glass of hard liquor (44 mL). General instructions  Schedule regular health, dental, and eye exams.  Stay current with your vaccines.  Tell your health care provider if: ? You often feel depressed. ? You have ever been abused or do not feel safe at home. Summary  Adopting a healthy lifestyle and getting preventive care are important in promoting health and wellness.  Follow your health care provider's instructions about healthy  diet, exercising, and getting tested or screened for diseases.  Follow your health care provider's instructions on monitoring your cholesterol and blood pressure. This information is not intended to replace advice given to you by your health care provider. Make sure you discuss any questions you have with your health care provider. Document Revised: 03/13/2018 Document Reviewed: 03/13/2018 Elsevier Patient Education  2020 Elsevier Inc.  

## 2019-10-10 ENCOUNTER — Encounter: Payer: Self-pay | Admitting: Advanced Practice Midwife

## 2019-10-10 LAB — HSV(HERPES SIMPLEX VRS) I + II AB-IGG
HSV 1 Glycoprotein G Ab, IgG: 45.7 index — ABNORMAL HIGH (ref 0.00–0.90)
HSV 2 IgG, Type Spec: <0.91 index (ref 0.00–0.90)

## 2019-10-10 LAB — HEPATITIS PANEL, ACUTE
Hep A IgM: NEGATIVE
Hep B C IgM: NEGATIVE
Hep C Virus Ab: <0.1 s/co ratio (ref 0.0–0.9)
Hepatitis B Surface Ag: NEGATIVE

## 2019-10-10 LAB — RPR QUALITATIVE: RPR Ser Ql: NONREACTIVE

## 2019-10-10 LAB — HIV ANTIBODY (ROUTINE TESTING W REFLEX): HIV Screen 4th Generation wRfx: NONREACTIVE

## 2019-10-10 MED ORDER — VALACYCLOVIR HCL 500 MG PO TABS: 500.0000 mg | ORAL_TABLET | Freq: Two times a day (BID) | ORAL | 6 refills | Status: AC

## 2019-10-10 NOTE — Progress Notes (Signed)
Gynecology Annual Exam   PCP: Erin Fulling, MD (Inactive)  Chief Complaint:  Chief Complaint  Patient presents with  . Gynecologic Exam    Discuss fertility, trying to get pregnant x2 years, ectopic few years ago    History of Present Illness: Patient is a 29 y.o. [name] presents for annual exam. The patient has complaints today of sharp pain in her lower pelvis when she sneezes or coughs. She request STD testing today. She and her partner have been trying to conceive for the past 2 years without success. She has an Ob history of "at least" 2 TABs (she is unsure how many exactly), a full term LTCS in 2012, and an ectopic pregnancy in 2013 with Right salpingectomy. She had an HSG in 2016 ([facility]) and per report the tubes were not visualized. She would like to have another HSG to see if her left tube is open.    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   She has a history of HGSIL with LEEP in 2016 ([facility])  LMP: Patient's last menstrual period was 09/16/2019. Average Interval: regular, Heather days Duration of flow: 5 days Heavy Menses: no Clots: no Intermenstrual Bleeding: no Postcoital Bleeding: no Dysmenorrhea: no  The patient is sexually active. She currently uses none for contraception. She denies dyspareunia.  The patient does perform self breast exams.  There is no notable family history of breast or ovarian cancer in her family.  The patient wears seatbelts: yes.   The patient has regular exercise: she does some walking and swimming. She admits eating a lot of fast food and drinks mostly sweet tea and smoothies. She admits adequate sleep.    The patient denies current symptoms of depression.  She has had some depression and was self medicating with marijuana. She tried counseling which did not help. Currently she is coping ok.   Review of Systems: Review of Systems  Constitutional:  Negative for chills and fever.  HENT: Negative for congestion, ear discharge, ear pain, hearing loss, sinus pain and sore throat.   Eyes: Negative for blurred vision and double vision.  Respiratory: Negative for cough, shortness of breath and wheezing.   Cardiovascular: Negative for chest pain, palpitations and leg swelling.  Gastrointestinal: Positive for abdominal pain. Negative for blood in stool, constipation, diarrhea, heartburn, melena, nausea and vomiting.  Genitourinary: Negative for dysuria, flank pain, frequency, hematuria and urgency.  Musculoskeletal: Negative for back pain, joint pain and myalgias.  Skin: Negative for itching and rash.  Neurological: Negative for dizziness, tingling, tremors, sensory change, speech change, focal weakness, seizures, loss of consciousness, weakness and headaches.  Endo/Heme/Allergies: Negative for environmental allergies. Does not bruise/bleed easily.  Psychiatric/Behavioral: Negative for depression, hallucinations, memory loss, substance abuse and suicidal ideas. The patient is not nervous/anxious and does not have insomnia.     Past Medical History:  There are no problems to display for this patient.   Past Surgical History:  Past Surgical History:  Procedure Laterality Date  . ECTOPIC PREGNANCY SURGERY      Gynecologic History:  Patient's last menstrual period was 09/16/2019. 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 No pertinent family history.  Social History:  Social History   Socioeconomic History  . Marital status: Single    Spouse name: Not on file  .  Number of children: Not on file  . Years of education: Not on file  . Highest education level: Not on file  Occupational History  . Not on file  Tobacco Use  . Smoking status: Current Every Day Smoker     Types: Cigars  . Smokeless tobacco: Never Used  Vaping Use  . Vaping Use: Never used  Substance and Sexual Activity  . Alcohol use: Yes    Comment: social  . Drug use: Yes    Types: Marijuana  . Sexual activity: Yes    Birth control/protection: None  Other Topics Concern  . Not on file  Social History Narrative  . Not on file   Social Determinants of Health   Financial Resource Strain:   . Difficulty of Paying Living Expenses:   Food Insecurity:   . Worried About Programme researcher, broadcasting/film/video in the Last Year:   . Barista in the Last Year:   Transportation Needs:   . Freight forwarder (Medical):   Marland Kitchen Lack of Transportation (Non-Medical):   Physical Activity:   . Days of Exercise per Week:   . Minutes of Exercise per Session:   Stress:   . Feeling of Stress :   Social Connections:   . Frequency of Communication with Friends and Family:   . Frequency of Social Gatherings with Friends and Family:   . Attends Religious Services:   . Active Member of Clubs or Organizations:   . Attends Banker Meetings:   Marland Kitchen Marital Status:   Intimate Partner Violence:   . Fear of Current or Ex-Partner:   . Emotionally Abused:   Marland Kitchen Physically Abused:   . Sexually Abused:     Allergies:  No Known Allergies  Medications: Prior to Admission medications   Medication Sig Start Date End Date Taking? Authorizing Provider  albuterol (PROVENTIL HFA;VENTOLIN HFA) 108 (90 Base) MCG/ACT inhaler Inhale 2 puffs into the lungs every 4 (four) hours as needed for wheezing or shortness of breath. 06/23/16  Yes Hassan Rowan, MD    Physical Exam Vitals: Blood pressure 118/70, pulse 81, height 5\' 2"  (1.575 m), weight 171 lb (77.6 kg), last menstrual period 09/16/2019.  General: NAD HEENT: normocephalic, anicteric Thyroid: no enlargement, no palpable nodules Pulmonary: No increased work of breathing, CTAB Cardiovascular: RRR, distal pulses 2+ Breast: Breast symmetrical, no tenderness,  no palpable nodules or masses, no skin or nipple retraction present, no nipple discharge.  No axillary or supraclavicular lymphadenopathy. Abdomen: NABS, soft, non-tender, non-distended.  Umbilicus without lesions.  No hepatomegaly, splenomegaly or masses palpable. No evidence of hernia  Genitourinary:  External: Normal external female genitalia.  Normal urethral meatus, normal Bartholin's and Skene's glands.    Vagina: Normal vaginal mucosa, no evidence of prolapse.    Cervix: Grossly normal in appearance, no bleeding, no CMT  Uterus: Non-enlarged, mobile, normal contour.    Adnexa: ovaries non-enlarged, no adnexal masses  Rectal: deferred  Lymphatic: no evidence of inguinal lymphadenopathy Extremities: no edema, erythema, or tenderness Neurologic: Grossly intact Psychiatric: mood appropriate, affect full  Update to visit:  Results for CELSEY, ASSELIN (MRN Becky Augusta) as of 10/10/2019 16:37  Ref. Range 10/09/2019 14:52  RPR Latest Ref Range: Non Reactive  Non Reactive  HSV 1 Glycoprotein G Ab, IgG Latest Ref Range: 0.00 - 0.90 index 45.70 (H)  HSV 2 IGG,TYPE SPECIFIC AB Latest Ref Range: 0.00 - 0.90 index <0.91  Hep A Ab, IgM Latest Ref Range: Negative  Negative  Hepatitis B  Surface Ag Latest Ref Range: Negative  Negative  Hep B Core Ab, IgM Latest Ref Range: Negative  Negative  Hep C Virus Ab Latest Ref Range: 0.0 - 0.9 s/co ratio <0.1  HIV Screen 4th Generation wRfx Latest Ref Range: Non Reactive  Non Reactive  Still waiting for PAPtima results  Assessment: 29 y.o. [id number] routine annual exam  Plan: Problem List Items Addressed This Visit    None    Visit Diagnoses    Well woman exam with routine gynecological exam    -  Primary   Relevant Orders   Cytology - PAP   Hepatitis panel, acute (Completed)   HIV Antibody (routine testing w rflx) (Completed)   RPR Qual (Completed)   HSV(herpes simplex vrs) 1+2 ab-IgG (Completed)   Screen for sexually transmitted diseases        Relevant Orders   Cytology - PAP   Hepatitis panel, acute (Completed)   HIV Antibody (routine testing w rflx) (Completed)   RPR Qual (Completed)   HSV(herpes simplex vrs) 1+2 ab-IgG (Completed)   Cervical cancer screening       Relevant Orders   Cytology - PAP   HSV-1 infection       Relevant Medications   valACYclovir (VALTREX) 500 MG tablet      1) STI screening  was offered and accepted  2)  ASCCP guidelines and rationale discussed.  Patient opts for yearly screening interval until normal or no progression  3) Contraception - the patient is currently using  none.  She is attempting to conceive in the near future  4) Routine healthcare maintenance including cholesterol, diabetes screening discussed Declines  5) Return in about 1 year (around 10/08/2020) for annual established gyn.   6) Follow up with MD for HSG   Tresea Mall, CNM [facility] 10/10/2019, 4:38 PM

## 2019-10-14 LAB — CYTOLOGY - PAP
Comment: NEGATIVE
Diagnosis: NEGATIVE
High risk HPV: NEGATIVE

## 2019-10-28 ENCOUNTER — Ambulatory Visit: Payer: Self-pay | Admitting: Obstetrics & Gynecology

## 2019-11-04 ENCOUNTER — Ambulatory Visit: Payer: Self-pay | Admitting: Obstetrics & Gynecology

## 2019-11-11 ENCOUNTER — Ambulatory Visit (INDEPENDENT_AMBULATORY_CARE_PROVIDER_SITE_OTHER): Payer: 59 | Admitting: Obstetrics and Gynecology

## 2019-11-11 ENCOUNTER — Encounter: Payer: Self-pay | Admitting: Obstetrics and Gynecology

## 2019-11-11 VITALS — BP 96/66 | HR 76 | Wt 170.6 lb

## 2019-11-11 DIAGNOSIS — N839 Noninflammatory disorder of ovary, fallopian tube and broad ligament, unspecified: Secondary | ICD-10-CM | POA: Diagnosis not present

## 2019-11-11 DIAGNOSIS — N979 Female infertility, unspecified: Secondary | ICD-10-CM

## 2019-11-11 DIAGNOSIS — Z8759 Personal history of other complications of pregnancy, childbirth and the puerperium: Secondary | ICD-10-CM | POA: Diagnosis not present

## 2019-11-11 NOTE — Progress Notes (Signed)
HPI:      Heather [name] is a 29 y.o. [id number] who LMP was Patient's last menstrual period was 10/16/2019 (approximate).  Subjective:   She presents today to discuss infertility.  She states that she has been trying to get pregnant on and off for the last 3 years.  She is not using any birth control.  She has previously had a number of pregnancies.  Abortion, abortion, cesarean birth for fetal indications, ectopic pregnancy with left salpingectomy.  She has not become pregnant since that time. She denies history of STDs. Of significant note several years ago the patient had an HSG which revealed no spill from either side. She is having normal regular cycles monthly. Her partner has never fathered a child.    Hx: The following portions of the patient's history were reviewed and updated as appropriate:             She  has a past medical history of Anxiety. She does not have a problem list on file. She  has a past surgical history that includes Ectopic pregnancy surgery. Her family history is not on file. She  reports that she has been smoking cigars. She has never used smokeless tobacco. She reports current alcohol use. She reports current drug use. Drug: Marijuana. She has a current medication list which includes the following prescription(s): albuterol. She has No Known Allergies.       Review of Systems:  Review of Systems  Constitutional: Denied constitutional symptoms, night sweats, recent illness, fatigue, fever, insomnia and weight loss.  Eyes: Denied eye symptoms, eye pain, photophobia, vision change and visual disturbance.  Ears/Nose/Throat/Neck: Denied ear, nose, throat or neck symptoms, hearing loss, nasal discharge, sinus congestion and sore throat.  Cardiovascular: Denied cardiovascular symptoms, arrhythmia, chest pain/pressure, edema, exercise intolerance, orthopnea and palpitations.  Respiratory: Denied pulmonary symptoms, asthma, pleuritic pain, productive sputum,  cough, dyspnea and wheezing.  Gastrointestinal: Denied, gastro-esophageal reflux, melena, nausea and vomiting.  Genitourinary: Denied genitourinary symptoms including symptomatic vaginal discharge, pelvic relaxation issues, and urinary complaints.  Musculoskeletal: Denied musculoskeletal symptoms, stiffness, swelling, muscle weakness and myalgia.  Dermatologic: Denied dermatology symptoms, rash and scar.  Neurologic: Denied neurology symptoms, dizziness, headache, neck pain and syncope.  Psychiatric: Denied psychiatric symptoms, anxiety and depression.  Endocrine: Denied endocrine symptoms including hot flashes and night sweats.   Meds:   Current Outpatient Medications on File Prior to Visit  Medication Sig Dispense Refill   albuterol (PROVENTIL HFA;VENTOLIN HFA) 108 (90 Base) MCG/ACT inhaler Inhale 2 puffs into the lungs every 4 (four) hours as needed for wheezing or shortness of breath. 1 Inhaler 0   No current facility-administered medications on file prior to visit.    Objective:     Vitals:   11/11/19 0745  BP: 96/66  Pulse: 76                Assessment:    [id number] There are no problems to display for this patient.    1. Infertility, female   2. Fallopian tube disorder   3. History of ectopic pregnancy     Patient has tubal infertility.  It is likely that her right fallopian tube is blocked based on HSG and the fact that her left tube was removed for ectopic pregnancy.   Plan:            1.  We have discussed methods of fertility in those with blocked tubes.  The positives of ovulating monthly was discussed.  All of  her questions were answered.  I recommended ambulatory referral to infertility Dr. April [name]. Orders Orders Placed This Encounter  Procedures   Ambulatory referral to Infertility    No orders of the defined types were placed in this encounter.     F/U  No follow-ups on file. I spent 32 minutes involved in the care of this patient preparing to  see the patient by obtaining and reviewing her medical history (including labs, imaging tests and prior procedures), documenting clinical information in the electronic health record (EHR), counseling and coordinating care plans, writing and sending prescriptions, ordering tests or procedures and directly communicating with the patient by discussing pertinent items from her history and physical exam as well as detailing my assessment and plan as noted above so that she has an informed understanding.  All of her questions were answered.  Heather [name], M.D. 11/11/2019 8:57 AM

## 2019-11-24 ENCOUNTER — Ambulatory Visit: Payer: Self-pay | Admitting: Obstetrics & Gynecology

## 2020-07-21 ENCOUNTER — Emergency Department: Payer: Medicaid Other

## 2020-07-21 ENCOUNTER — Encounter: Payer: Self-pay | Admitting: Emergency Medicine

## 2020-07-21 ENCOUNTER — Emergency Department
Admission: EM | Admit: 2020-07-21 | Discharge: 2020-07-21 | Disposition: A | Discharge status: Home / Self Care | Payer: Medicaid Other | Attending: Emergency Medicine | Admitting: Emergency Medicine

## 2020-07-21 ENCOUNTER — Other Ambulatory Visit: Payer: Self-pay

## 2020-07-21 DIAGNOSIS — Z3A01 Less than 8 weeks gestation of pregnancy: Secondary | ICD-10-CM | POA: Diagnosis not present

## 2020-07-21 DIAGNOSIS — O200 Threatened abortion: Secondary | ICD-10-CM | POA: Diagnosis present

## 2020-07-21 DIAGNOSIS — F17290 Nicotine dependence, other tobacco product, uncomplicated: Secondary | ICD-10-CM | POA: Insufficient documentation

## 2020-07-21 HISTORY — DX: Unspecified ectopic pregnancy without intrauterine pregnancy: O00.90

## 2020-07-21 LAB — URINALYSIS, COMPLETE (UACMP) WITH MICROSCOPIC
Bilirubin Urine: NEGATIVE
Glucose, UA: NEGATIVE mg/dL
Ketones, ur: NEGATIVE mg/dL
Leukocytes,Ua: NEGATIVE
Nitrite: NEGATIVE
Protein, ur: NEGATIVE mg/dL
Specific Gravity, Urine: 1.003 — ABNORMAL LOW (ref 1.005–1.030)
pH: 7 (ref 5.0–8.0)

## 2020-07-21 LAB — COMPREHENSIVE METABOLIC PANEL
ALT: 18 U/L (ref 0–44)
AST: 20 U/L (ref 15–41)
Albumin: 4.2 g/dL (ref 3.5–5.0)
Alkaline Phosphatase: 60 U/L (ref 38–126)
Anion gap: 7 (ref 5–15)
BUN: 7 mg/dL (ref 6–20)
CO2: 24 mmol/L (ref 22–32)
Calcium: 9.6 mg/dL (ref 8.9–10.3)
Chloride: 106 mmol/L (ref 98–111)
Creatinine, Ser: 0.6 mg/dL (ref 0.44–1.00)
GFR, Estimated: >60 mL/min (ref >60)
Glucose, Bld: 95 mg/dL (ref 70–99)
Potassium: 4.1 mmol/L (ref 3.5–5.1)
Sodium: 137 mmol/L (ref 135–145)
Total Bilirubin: 0.5 mg/dL (ref 0.3–1.2)
Total Protein: 7.5 g/dL (ref 6.5–8.1)

## 2020-07-21 LAB — CBC
HCT: 35.4 % — ABNORMAL LOW (ref 36.0–46.0)
Hemoglobin: 11.3 g/dL — ABNORMAL LOW (ref 12.0–15.0)
MCH: 23 pg — ABNORMAL LOW (ref 26.0–34.0)
MCHC: 31.9 g/dL (ref 30.0–36.0)
MCV: 72.1 fL — ABNORMAL LOW (ref 80.0–100.0)
Platelets: 175 10*3/uL (ref 150–400)
RBC: 4.91 MIL/uL (ref 3.87–5.11)
RDW: 20.7 % — ABNORMAL HIGH (ref 11.5–15.5)
WBC: 7.3 10*3/uL (ref 4.0–10.5)
nRBC: 0 % (ref 0.0–0.2)

## 2020-07-21 LAB — ABO/RH: ABO/RH(D): O NEG

## 2020-07-21 LAB — LIPASE, BLOOD: Lipase: 41 U/L (ref 11–51)

## 2020-07-21 LAB — POC URINE PREG, ED: Preg Test, Ur: POSITIVE — AB

## 2020-07-21 LAB — HCG, QUANTITATIVE, PREGNANCY: hCG, Beta Chain, Quant, S: 1474 m[IU]/mL — ABNORMAL HIGH (ref <5)

## 2020-07-21 MED ORDER — RHO D IMMUNE GLOBULIN 1500 UNIT/2ML IJ SOSY
300.0000 ug | PREFILLED_SYRINGE | Freq: Once | INTRAMUSCULAR | Status: DC
  Filled 2020-07-21: qty 2

## 2020-07-21 NOTE — ED Provider Notes (Signed)
[facility] Emergency Department Provider Note  Time seen: 1:16 PM  I have reviewed the triage vital signs and the nursing notes.   HISTORY  Chief Complaint Abdominal Pain   HPI Heather [name] is a 30 y.o. female with a past medical history anxiety, prior ectopic, presents to the emergency department for lower abdominal cramping and vaginal spotting.  Patient is not entirely clear when her last period was she thinks at the beginning of April but states it is somewhat irregular.  States she has been having some lower abdominal spotting and cramping and is concerned that she could be pregnant and is even more concerned since she had a prior ectopic requiring fallopian tube removal previously per patient.  Patient states mild pain mostly in the left pelvis described more as a cramping sensation.  Slight vaginal spotting since yesterday.   Past Medical History:  Diagnosis Date  . Anxiety   . Ectopic pregnancy     There are no problems to display for this patient.   Past Surgical History:  Procedure Laterality Date  . ECTOPIC PREGNANCY SURGERY      Prior to Admission medications   Medication Sig Start Date End Date Taking? Authorizing Provider  albuterol (PROVENTIL HFA;VENTOLIN HFA) 108 (90 Base) MCG/ACT inhaler Inhale 2 puffs into the lungs every 4 (four) hours as needed for wheezing or shortness of breath. 06/23/16   Hassan Rowan, MD    No Known Allergies  History reviewed. No pertinent family history.  Social History Social History   Tobacco Use  . Smoking status: Current Every Day Smoker    Types: Cigars  . Smokeless tobacco: Never Used  Vaping Use  . Vaping Use: Never used  Substance Use Topics  . Alcohol use: Yes    Comment: social  . Drug use: Yes    Types: Marijuana    Review of Systems Constitutional: Negative for fever. Cardiovascular: Negative for chest pain. Respiratory: Negative for shortness of breath. Gastrointestinal:  Mild left lower abdominal discomfort.  Negative for vomiting or diarrhea. Genitourinary: Mild vaginal spotting.  No dysuria.  No discharge. Musculoskeletal: Negative for musculoskeletal complaints Neurological: Negative for headache All other ROS negative  ____________________________________________   PHYSICAL EXAM:  VITAL SIGNS: ED Triage Vitals  Enc Vitals Group     BP 07/21/20 1234 (!) 139/91     Pulse Rate 07/21/20 1234 86     Resp 07/21/20 1234 18     Temp 07/21/20 1234 98.3 F (36.8 C)     Temp Source 07/21/20 1234 Oral     SpO2 07/21/20 1234 100 %     Weight 07/21/20 1235 156 lb (70.8 kg)     Height 07/21/20 1235 5\' 2"  (1.575 m)     Head Circumference --      Peak Flow --      Pain Score 07/21/20 1234 5     Pain Loc --      Pain Edu? --      Excl. in GC? --    Constitutional: Alert and oriented. Well appearing and in no distress. Eyes: Normal exam ENT      Head: Normocephalic and atraumatic.      Mouth/Throat: Mucous membranes are moist. Cardiovascular: Normal rate, regular rhythm. Respiratory: Normal respiratory effort without tachypnea nor retractions. Breath sounds are clear Gastrointestinal: Soft, very slight left very low tenderness to palpation.  No rebound guarding or distention. Musculoskeletal: Nontender with normal range of motion in all extremities. Neurologic:  Normal speech  and language. No gross focal neurologic deficits  Skin:  Skin is warm, dry and intact.  Psychiatric: Mood and affect are normal.   RADIOLOGY  Ultrasound shows no IUP  ____________________________________________   INITIAL IMPRESSION / ASSESSMENT AND PLAN / ED COURSE  Pertinent labs & imaging results that were available during my care of the patient were reviewed by me and considered in my medical decision making (see chart for details).   Patient presents emergency department for left lower quadrant abdominal discomfort and vaginal spotting concerned she could be pregnant  given her past history of a prior ectopic.  Patient's labs show positive urine pregnancy test we will obtain a beta hCG quantitative test.  We will also obtain an ultrasound.  Remainder the lab work is largely nonrevealing.  Differential would include threatened miscarriage, miscarriage, ectopic, early pregnancy  Patient's beta hCG is 1400.  Ultrasound results showed no IUP.  Discussed with the patient this could possibly be an early pregnancy but it does not rule out ectopic pregnancy until we are able to visualize an intrauterine pregnancy on ultrasound.  Patient is to follow-up with OB/GYN however she is to return immediately to the emergency department for any return of/worsening abdominal pain or increased vaginal spotting/bleeding.  Patient agreeable to plan of care.  On reviewing the patient's records it appears that the patient is O- blood type.  We will order RhoGAM for the patient given her spotting.  Patient states she needs to leave.  States she will come back later for the RhoGAM injection.  I described the importance of the RhoGAM injection and why she would need.  States she understands but she cannot stay at this time for the injection and will come back later.  Heather [name] was evaluated in Emergency Department on 07/21/2020 for the symptoms described in the history of present illness. She was evaluated in the context of the global COVID-19 pandemic, which necessitated consideration that the patient might be at risk for infection with the SARS-CoV-2 virus that causes COVID-19. Institutional protocols and algorithms that pertain to the evaluation of patients at risk for COVID-19 are in a state of rapid change based on information released by regulatory bodies including the CDC and federal and state organizations. These policies and algorithms were followed during the patient's care in the ED.  ____________________________________________   FINAL CLINICAL IMPRESSION(S) / ED  DIAGNOSES  Threatened miscarriage   Minna Antis, MD 07/21/20 1524

## 2020-07-21 NOTE — ED Triage Notes (Signed)
Pt comes into the ED via POV c/o lower abdominal pain x1 week.  Pt states she was having spotting outside of her normal menstrual cycle.  Pt denies any new sexual partners.  Pt denies any N/V/D, but states she has had some constipation.  Last normal bowel movement was today with the assistance of OTC medications. Marland Kitchen

## 2020-07-21 NOTE — Discharge Instructions (Addendum)
As we discussed please follow-up with your OB/GYN in 48 hours for recheck of your pregnancy hormone level.  Today's level is 1400.  Return to the emergency department immediately if you have any worsening/return of lower abdominal pain increased vaginal bleeding or any other symptom personally concerning for self. Also as we discussed your blood type is O- and you should receive a RhoGAM injection to help prevent future miscarriages.  Please follow-up with your OB/GYN for this injection or return to the emergency department.

## 2020-07-21 NOTE — ED Notes (Signed)
All documentation completed by Dorian James reviewed and approved by this RN 

## 2020-07-23 ENCOUNTER — Ambulatory Visit (INDEPENDENT_AMBULATORY_CARE_PROVIDER_SITE_OTHER): Payer: Medicaid Other | Admitting: Obstetrics and Gynecology

## 2020-07-23 ENCOUNTER — Other Ambulatory Visit: Payer: Self-pay

## 2020-07-23 ENCOUNTER — Encounter: Payer: Self-pay | Admitting: Obstetrics and Gynecology

## 2020-07-23 VITALS — BP 124/74 | Ht 62.0 in | Wt 163.0 lb

## 2020-07-23 DIAGNOSIS — O209 Hemorrhage in early pregnancy, unspecified: Secondary | ICD-10-CM | POA: Diagnosis not present

## 2020-07-23 DIAGNOSIS — O3680X0 Pregnancy with inconclusive fetal viability, not applicable or unspecified: Secondary | ICD-10-CM | POA: Diagnosis not present

## 2020-07-23 DIAGNOSIS — O26891 Other specified pregnancy related conditions, first trimester: Secondary | ICD-10-CM | POA: Diagnosis not present

## 2020-07-23 DIAGNOSIS — Z6791 Unspecified blood type, Rh negative: Secondary | ICD-10-CM | POA: Diagnosis not present

## 2020-07-23 MED ORDER — RHO D IMMUNE GLOBULIN 1500 UNIT/2ML IJ SOSY
300.0000 ug | PREFILLED_SYRINGE | Freq: Once | INTRAMUSCULAR | Status: AC
  Administered 2020-07-23: 300 ug via INTRAMUSCULAR

## 2020-07-23 NOTE — Progress Notes (Signed)
Obstetrics & Gynecology Office Visit   Chief Complaint:  Chief Complaint  Patient presents with  . Follow-up  ER follow up for abdominal pain, cramping, vaginal spotting in early pregnancy  History of Present Illness: 30 y.o. [id number] female who presents in follow up from an ER visit on 07/21/2020. She presented with abdominal cramping and spotting. She reports a history of ectopic pregnancy and sapingectomy.  She had a beta hCG of 1,400.  SHe had a pelvic u/s that showed no intrauterine pregnancy.  She is Rh negative. She left the ER prior to receiving the rhogam injection.  She had an ectopic pregnancy about 8 years ago.  She had a ruptured right ectopic pregnancy in 2013 and underwent lysis of adhesions nad right salpingectomy.    Her bleeding started about 3-4 days ago. Her LMP was 07/01/20.  Her periods are regular, monthly, lasting 5 days. Her LMP was typical (maybe a little light).   She continues to have light pink/brown spotting.  She has mild cramps She points to her mid/left side for her location of her cramps. She had one successful pregnancy born by c-section in 2012.  This is the first pregnancy she has had since her ectopic pregnancy in 2013.  She denies a history of STDs and PID.   Past Medical History:  Diagnosis Date  . Anxiety   . Ectopic pregnancy     Past Surgical History:  Procedure Laterality Date  . CESAREAN SECTION  2012  . ECTOPIC PREGNANCY SURGERY      Gynecologic History: Patient's last menstrual period was 07/05/2020.  Obstetric History: [id number]  Family History  Problem Relation Age of Onset  . Ovarian cancer Neg Hx   . Breast cancer Neg Hx   . Diabetes Neg Hx     Social History   Socioeconomic History  . Marital status: Significant Other    Spouse name: Not on file  . Number of children: Not on file  . Years of education: Not on file  . Highest education level: Not on file  Occupational History  . Not on file  Tobacco Use  . Smoking  status: Current Every Day Smoker    Types: Cigars  . Smokeless tobacco: Never Used  Vaping Use  . Vaping Use: Never used  Substance and Sexual Activity  . Alcohol use: Yes    Comment: social  . Drug use: Yes    Types: Marijuana  . Sexual activity: Yes    Birth control/protection: None  Other Topics Concern  . Not on file  Social History Narrative  . Not on file   Social Determinants of Health   Financial Resource Strain: Not on file  Food Insecurity: Not on file  Transportation Needs: Not on file  Physical Activity: Not on file  Stress: Not on file  Social Connections: Not on file  Intimate Partner Violence: Not on file    No Known Allergies  Prior to Admission medications   Medication Sig Start Date End Date Taking? Authorizing Provider  albuterol (PROVENTIL HFA;VENTOLIN HFA) 108 (90 Base) MCG/ACT inhaler Inhale 2 puffs into the lungs every 4 (four) hours as needed for wheezing or shortness of breath. 06/23/16   Hassan Rowan, MD    Review of Systems  Constitutional: Negative.   HENT: Negative.   Eyes: Negative.   Respiratory: Negative.   Cardiovascular: Negative.   Gastrointestinal: Positive for abdominal pain (see HPI). Negative for blood in stool, constipation, diarrhea, heartburn, melena, nausea and vomiting.  Genitourinary:  Negative.        See HPI  Musculoskeletal: Negative.   Skin: Negative.   Neurological: Negative.   Psychiatric/Behavioral: Negative.      Physical Exam BP 124/74   Ht 5\' 2"  (1.575 m)   Wt 163 lb (73.9 kg)   LMP 07/05/2020   BMI 29.81 kg/m  Patient's last menstrual period was 07/05/2020. Physical Exam Constitutional:      General: She is not in acute distress.    Appearance: Normal appearance.  HENT:     Head: Normocephalic and atraumatic.  Eyes:     General: No scleral icterus.    Conjunctiva/sclera: Conjunctivae normal.  Neurological:     General: No focal deficit present.     Mental Status: She is alert and oriented to  person, place, and time.     Cranial Nerves: No cranial nerve deficit.  Psychiatric:        Mood and Affect: Mood normal.        Behavior: Behavior normal.        Judgment: Judgment normal.     Female chaperone present for pelvic and breast  portions of the physical exam  Assessment: 30 y.o. 38 female here for  1. Pregnancy of unknown anatomic location   2. Vaginal bleeding affecting early pregnancy   3. Rh negative status during pregnancy in first trimester      Plan: Problem List Items Addressed This Visit   None   Visit Diagnoses    Pregnancy of unknown anatomic location    -  Primary   Relevant Orders   Beta hCG quant (ref lab)   Vaginal bleeding affecting early pregnancy       Relevant Orders   Beta hCG quant (ref lab)   Rh negative status during pregnancy in first trimester       Relevant Medications   rho (d) immune globulin (RHIG/RHOPHYLAC) injection 300 mcg (Completed)     Discussed findings in ER.  Still too early to tell at this point. Repeat hCG and perform u/s when able.   Rhogam today. Differential would include early intrauterine pregnancy, ectopic pregnancy, threatened miscarriage. Gave precautions for going to ER  (pain and heavy bleeding).    A total of 33 minutes were spent face-to-face with the patient as well as preparation, review, communication, and documentation during this encounter.    [id number], MD 07/23/2020 4:45 PM

## 2020-07-24 LAB — BETA HCG QUANT (REF LAB): hCG Quant: 1211 m[IU]/mL

## 2020-07-26 ENCOUNTER — Other Ambulatory Visit: Payer: Self-pay

## 2020-07-26 ENCOUNTER — Encounter: Payer: Self-pay | Admitting: Obstetrics and Gynecology

## 2020-07-26 ENCOUNTER — Ambulatory Visit (INDEPENDENT_AMBULATORY_CARE_PROVIDER_SITE_OTHER): Payer: Medicaid Other | Admitting: Obstetrics and Gynecology

## 2020-07-26 VITALS — BP 118/74 | Ht 62.0 in

## 2020-07-26 DIAGNOSIS — O3680X0 Pregnancy with inconclusive fetal viability, not applicable or unspecified: Secondary | ICD-10-CM

## 2020-07-26 DIAGNOSIS — Z8759 Personal history of other complications of pregnancy, childbirth and the puerperium: Secondary | ICD-10-CM

## 2020-07-26 NOTE — Progress Notes (Signed)
ULTRASOUND REPORT  Location: [facility] Date of Service: 07/26/2020   Indications:Pregnancy of unknown anatomic location, history of ectopic pregnancy. Findings:  No intrauterine pregnancy visualized.  Endometrial stripe appears quite thin.  FHR: n/a CRL measurement: n/a  Right Ovary is normal in appearance. Left Ovary is normal appearance. Corpus luteal cyst:  is not visualized  Survey of the adnexa demonstrates no adnexal masses.  There is a small area on the left adnexa that has no abnormal blood flow on Doppler interrogation.  There is moderate free peritoneal fluid in the cul de sac.  Impression: No intrauterine pregnancy noted today.  Recommend correlation with physical exam, patient history, and lab findings.  Ectopic pregnancy is in the differential along with failed pregnancy.  Female chaperone present for pelvic exam:   I personally performed the ultrasound and interpreted the images.  Thomasene Mohair, MD, [facility], [facility] 07/26/2020 11:57 AM

## 2020-07-26 NOTE — Progress Notes (Signed)
Obstetrics & Gynecology Office Visit   Chief Complaint  Patient presents with  . Follow-up  for pelvic u/s for pregnancy with history of ectopic status post right salpingectomy  History of Present Illness: 30 y.o. [id number] female whose Patient's last menstrual period was 07/05/2020. She has had some mild lower abdominal pain (R>L).  She denies new vaginal spotting or bleeding. Her hCG values are as follows: 07/21/20: 1,474 (at [facility]) 07/23/2020: 1,211 (through [employer])  Pelvic ultrasound today reveals no intrauterine pregnancy.  Her right ovary is noted and appears normal.  There are no right adnexal structures that are abnormal appearing. Her left ovary appears normal.  There is a small adnexal structure adjacent to the left ovary that has no internal blood flow and is quite small. There is a moderate amount of free fluid in the posterior cul-de-sac. The images from this ultrasound are stored on the machine.  See separate report for details.   Past Medical History:  Diagnosis Date  . Anxiety   . Ectopic pregnancy     Past Surgical History:  Procedure Laterality Date  . CESAREAN SECTION  2012  . ECTOPIC PREGNANCY SURGERY      Gynecologic History: Patient's last menstrual period was 07/05/2020.  Obstetric History: [id number]  Family History  Problem Relation Age of Onset  . Ovarian cancer Neg Hx   . Breast cancer Neg Hx   . Diabetes Neg Hx     Social History   Socioeconomic History  . Marital status: Significant Other    Spouse name: Not on file  . Number of children: Not on file  . Years of education: Not on file  . Highest education level: Not on file  Occupational History  . Not on file  Tobacco Use  . Smoking status: Current Every Day Smoker    Types: Cigars  . Smokeless tobacco: Never Used  Vaping Use  . Vaping Use: Never used  Substance and Sexual Activity  . Alcohol use: Yes    Comment: social  . Drug use: Yes    Types: Marijuana  . Sexual activity:  Yes    Birth control/protection: None  Other Topics Concern  . Not on file  Social History Narrative  . Not on file   Social Determinants of Health   Financial Resource Strain: Not on file  Food Insecurity: Not on file  Transportation Needs: Not on file  Physical Activity: Not on file  Stress: Not on file  Social Connections: Not on file  Intimate Partner Violence: Not on file    No Known Allergies  Prior to Admission medications   Medication Sig Start Date End Date Taking? Authorizing Provider  albuterol (PROVENTIL HFA;VENTOLIN HFA) 108 (90 Base) MCG/ACT inhaler Inhale 2 puffs into the lungs every 4 (four) hours as needed for wheezing or shortness of breath. 06/23/16   Hassan Rowan, MD    Review of Systems  Constitutional: Negative.   HENT: Negative.   Eyes: Negative.   Respiratory: Negative.   Cardiovascular: Negative.   Gastrointestinal: Positive for abdominal pain (see HPI).  Genitourinary: Negative.   Musculoskeletal: Negative.   Skin: Negative.   Neurological: Negative.   Psychiatric/Behavioral: Negative.      Physical Exam BP 118/74   Ht 5\' 2"  (1.575 m)   LMP 07/05/2020   BMI 29.81 kg/m  Patient's last menstrual period was 07/05/2020. Physical Exam Constitutional:      General: She is not in acute distress.    Appearance: Normal appearance.  HENT:  Head: Normocephalic and atraumatic.  Eyes:     General: No scleral icterus.    Conjunctiva/sclera: Conjunctivae normal.  Neurological:     General: No focal deficit present.     Mental Status: She is alert and oriented to person, place, and time.     Cranial Nerves: No cranial nerve deficit.  Psychiatric:        Mood and Affect: Mood normal.        Behavior: Behavior normal.        Judgment: Judgment normal.     Female chaperone present for pelvic and breast  portions of the physical exam  Assessment: 29 y.o. [id number] female here for  1. Pregnancy of unknown anatomic location       Plan: Problem List Items Addressed This Visit   None   Visit Diagnoses    Pregnancy of unknown anatomic location    -  Primary   Relevant Orders   Beta hCG quant (ref lab)     Discussed that given the results of her hCG trend, this appears to be an abnormal pregnancy and will not develop into a normal pregnancy.  The next phase would be to determine whether this is a spontaneous abortion in the uterus or an ectopic pregnancy.  The pregnancy is at high risk of being ectopic due to the fact that she has a history of an ectopic pregnancy with noted adhesions during the surgery based on my reading of the report.  There is no definitive ectopic noted on ultrasound today.  She does not have any significant symptoms of ectopic today.  Since her hCG level was dropping last week, we will recheck a level today.  If the level continues to drop, we will continue to monitor until resolution.  If level does not drop, we may have to treat presumptively as an ectopic pregnancy.  Discussed all this with the patient and she voiced understanding and agreement to proceed.  A total of 24 minutes were spent face-to-face with the patient as well as preparation, review, communication, and documentation during this encounter.    Thomasene Mohair, MD 07/26/2020 11:49 AM

## 2020-07-27 LAB — BETA HCG QUANT (REF LAB): hCG Quant: 1769 m[IU]/mL

## 2020-08-03 ENCOUNTER — Telehealth: Payer: Self-pay | Admitting: Obstetrics and Gynecology

## 2020-08-03 NOTE — Telephone Encounter (Signed)
Called patient to follow up on trending hCG levels. When I entered her chart I could see that she'd been to [facility] ED in [place] and they were very concerned that she had an ectopic pregnancy. She appears to have tried to get into [facility].  When I called her today, she stated that she was at the ER at [facility] to get the pregnancy situation resolved.  I wished her luck and offered any support she might need in the future.

## 2021-12-08 ENCOUNTER — Ambulatory Visit: Payer: Medicaid Other | Admitting: Obstetrics & Gynecology

## 2021-12-12 ENCOUNTER — Ambulatory Visit: Payer: Medicaid Other | Admitting: Obstetrics & Gynecology

## 2022-06-27 IMAGING — US US OB COMP LESS 14 WK
2 series · 14 of 28 positions shown · non-contrast
Comparison: None.

CLINICAL DATA: Lower abdominal cramping and spotting for the past
week. Estimated gestational age of 2 weeks, 6 days by LMP.

EXAM:
OBSTETRIC <14 WK US AND TRANSVAGINAL OB US
TECHNIQUE: Both transabdominal and transvaginal ultrasound examinations were
performed for complete evaluation of the gestation as well as the
maternal uterus, adnexal regions, and pelvic cul-de-sac.
Transvaginal technique was performed to assess early pregnancy.

[Series 1: us ob comp less 14 wks · 13 of 92 slices shown]
[im 4/92]
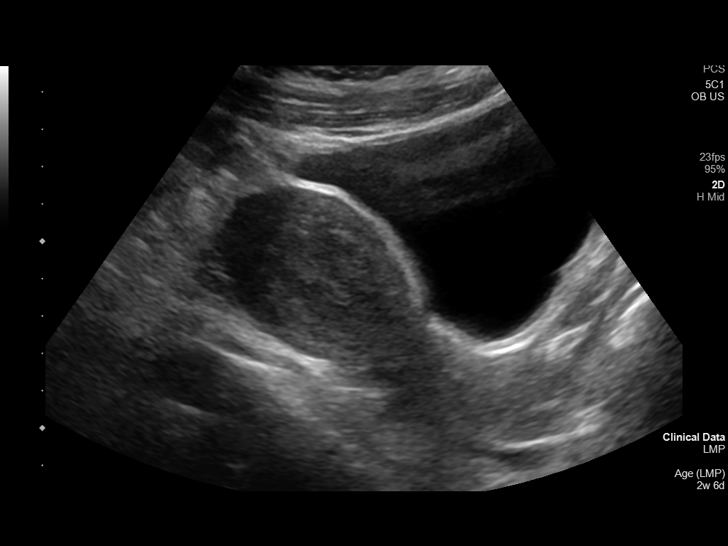
[im 11/92]
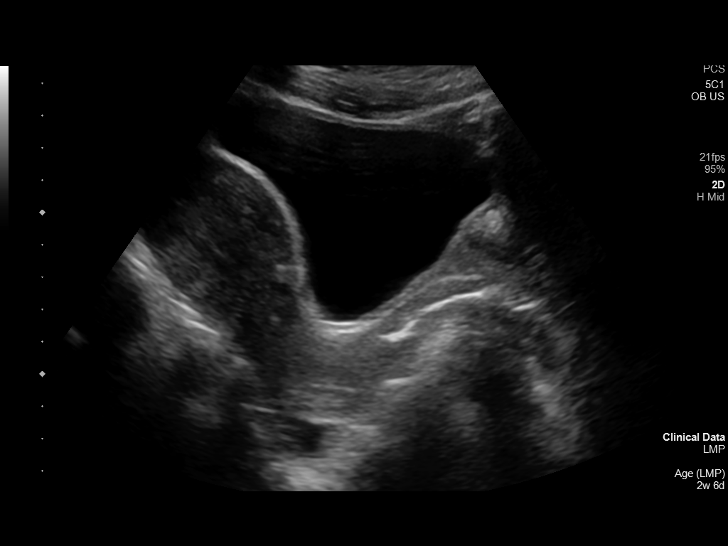
[im 18/92]
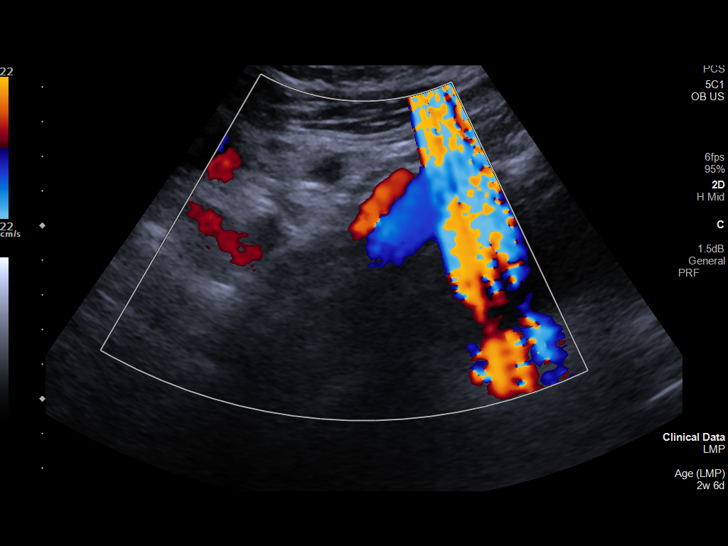
[im 25/92]
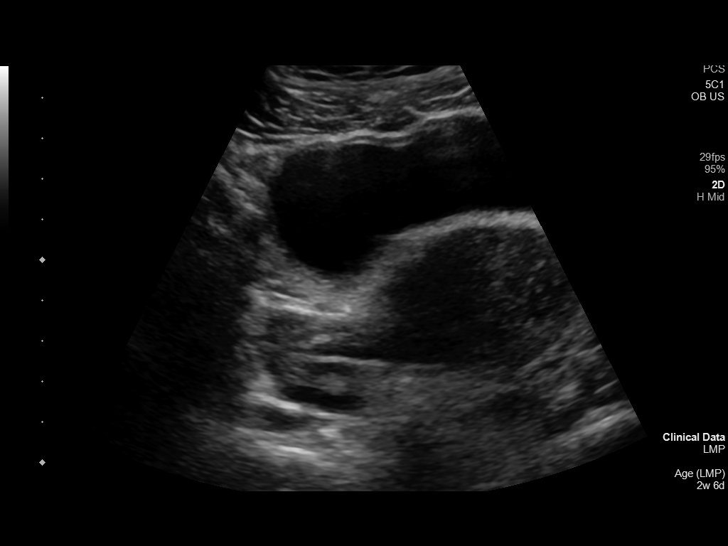
[im 32/92]
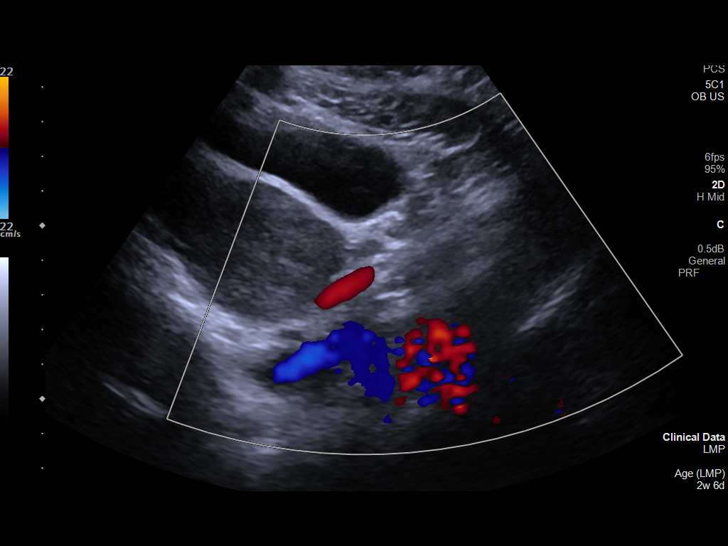
[im 39/92]
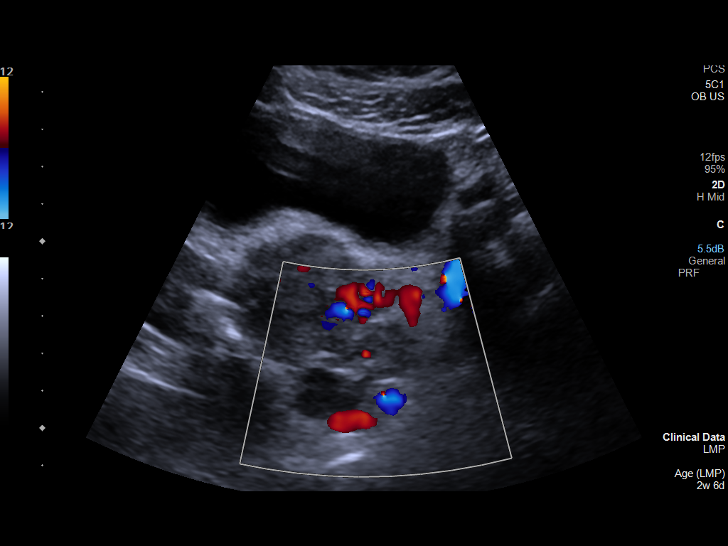
[im 46/92]
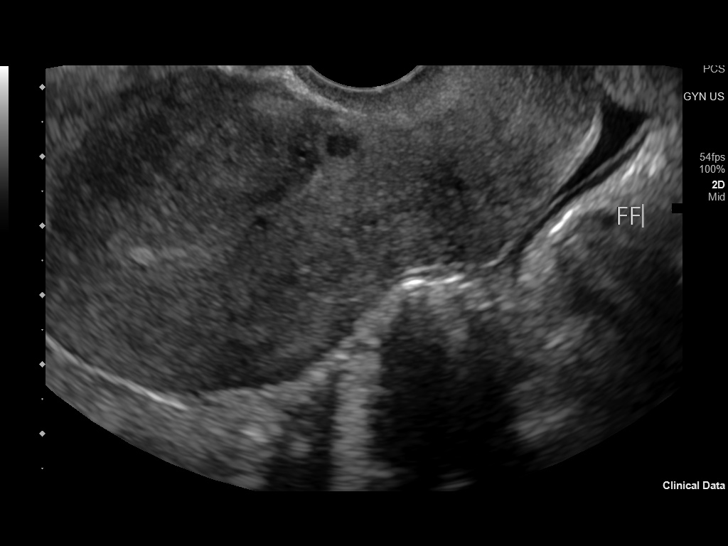
[im 53/92]
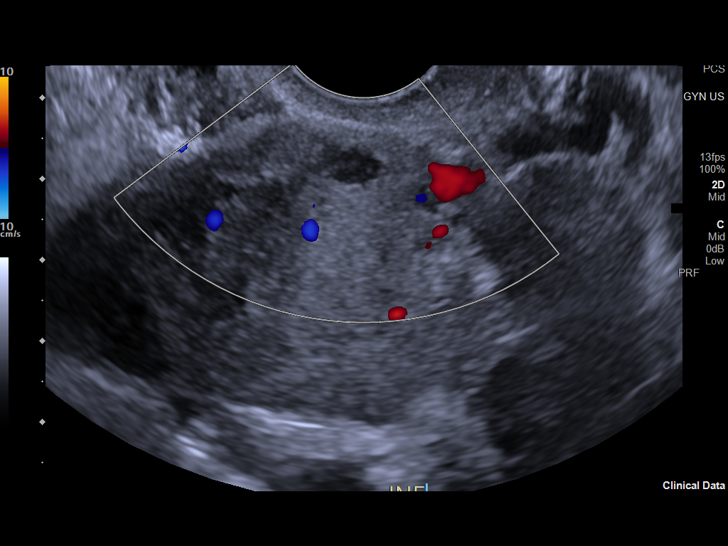
[im 60/92]
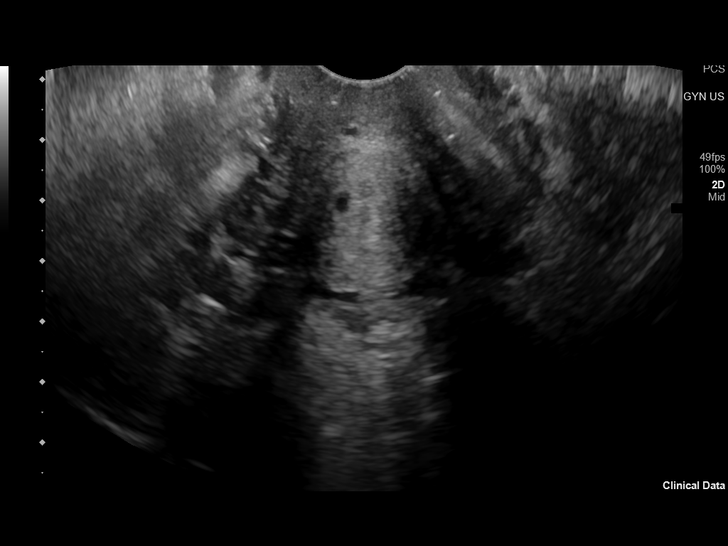
[im 67/92]
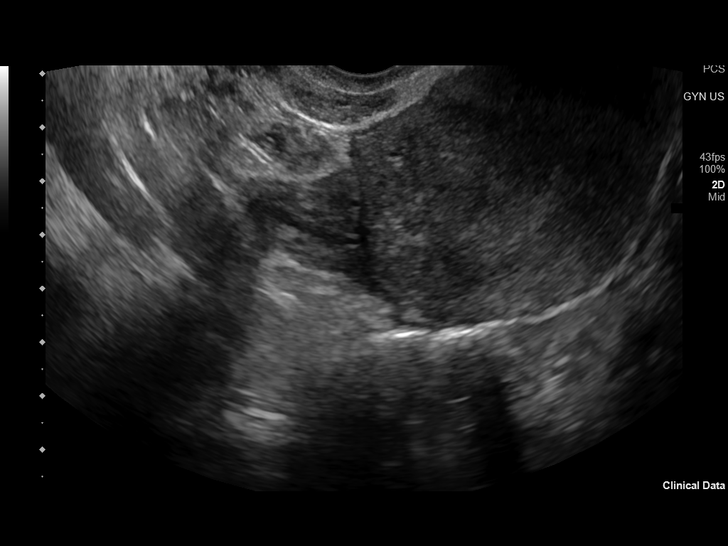
[im 74/92]
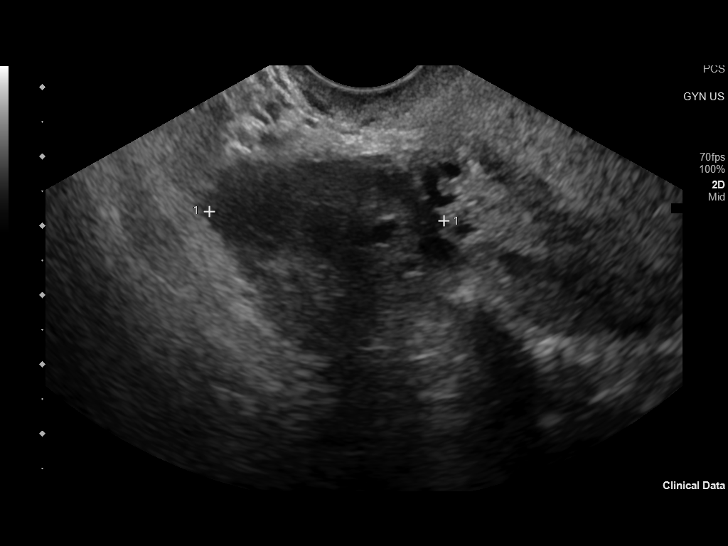
[im 81/92]
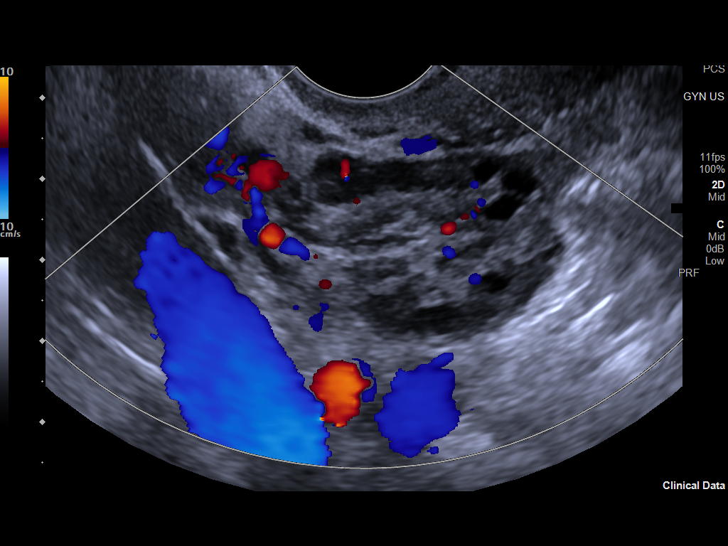
[im 88/92]
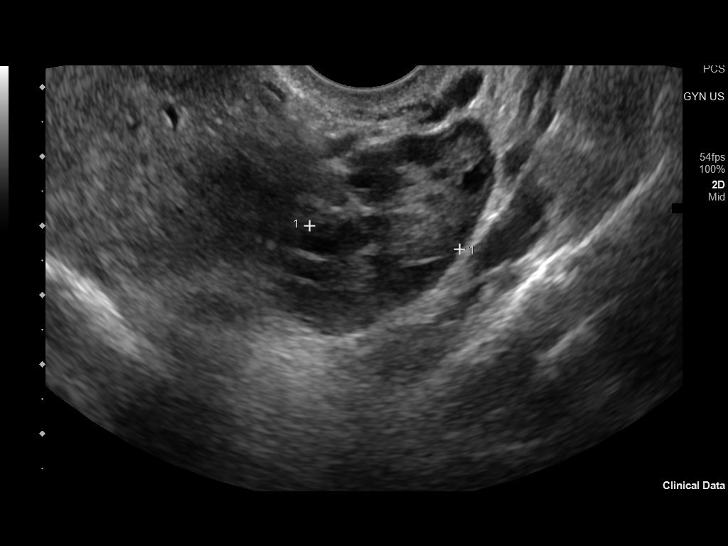

[Series 1001: ob us · 1 of 1 slices shown]
[im 1/1]
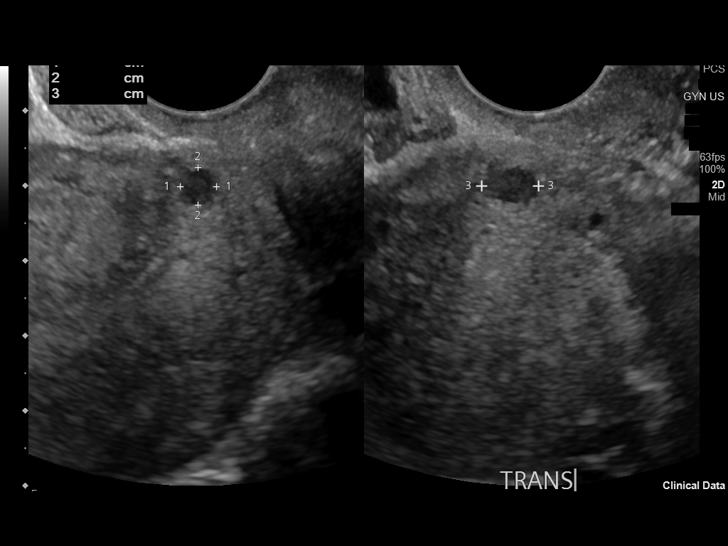

[14 of 28 positions shown; findings below may reference images not displayed]

FINDINGS: Intrauterine gestational sac: None.

Maternal uterus/adnexae: Small 8 mm intramural fibroid in the
anterior lower uterine segment. Normal sonographic appearance of the
ovaries. Trace free fluid in the pelvis, likely physiologic.
IMPRESSION: 1. No IUP is visualized. By definition, in the setting of a positive
pregnancy test, this reflects a pregnancy of unknown location.
Differential considerations include early normal IUP, abnormal
IUP/missed abortion, or nonvisualized ectopic pregnancy. Serial beta
HCG is suggested. Consider repeat pelvic ultrasound in 14 days.

## 2022-10-27 ENCOUNTER — Other Ambulatory Visit: Payer: Self-pay

## 2022-10-27 DIAGNOSIS — N939 Abnormal uterine and vaginal bleeding, unspecified: Secondary | ICD-10-CM | POA: Insufficient documentation

## 2022-10-27 DIAGNOSIS — Z5321 Procedure and treatment not carried out due to patient leaving prior to being seen by health care provider: Secondary | ICD-10-CM | POA: Insufficient documentation

## 2022-10-27 NOTE — ED Triage Notes (Signed)
Pt c/o LLQ abdominal pain that started 1 week ago. Started intermittently has now become constant for the past 3 days. Denies NVD. Endorses vaginal bleeding 3-4 days with large blood clots using. Has used post partum diapers, 2 throughout th day. Hx ectopic pregnancy.

## 2022-10-28 ENCOUNTER — Emergency Department
Admission: EM | Admit: 2022-10-28 | Discharge: 2022-10-28 | Discharge status: Against Medical Advice | Payer: Medicaid Other | Attending: Emergency Medicine | Admitting: Emergency Medicine

## 2022-10-28 LAB — CBC
HCT: 34.5 % — ABNORMAL LOW (ref 36.0–46.0)
Hemoglobin: 10.7 g/dL — ABNORMAL LOW (ref 12.0–15.0)
MCH: 24.1 pg — ABNORMAL LOW (ref 26.0–34.0)
MCHC: 31 g/dL (ref 30.0–36.0)
MCV: 77.7 fL — ABNORMAL LOW (ref 80.0–100.0)
Platelets: 202 10*3/uL (ref 150–400)
RBC: 4.44 MIL/uL (ref 3.87–5.11)
RDW: 14.8 % (ref 11.5–15.5)
WBC: 7.5 10*3/uL (ref 4.0–10.5)
nRBC: 0 % (ref 0.0–0.2)

## 2022-10-28 LAB — COMPREHENSIVE METABOLIC PANEL
ALT: 14 U/L (ref 0–44)
AST: 18 U/L (ref 15–41)
Albumin: 4.4 g/dL (ref 3.5–5.0)
Alkaline Phosphatase: 60 U/L (ref 38–126)
Anion gap: 10 (ref 5–15)
BUN: 9 mg/dL (ref 6–20)
CO2: 22 mmol/L (ref 22–32)
Calcium: 9.3 mg/dL (ref 8.9–10.3)
Chloride: 105 mmol/L (ref 98–111)
Creatinine, Ser: 0.92 mg/dL (ref 0.44–1.00)
GFR, Estimated: >60 mL/min (ref >60)
Glucose, Bld: 103 mg/dL — ABNORMAL HIGH (ref 70–99)
Potassium: 3.5 mmol/L (ref 3.5–5.1)
Sodium: 137 mmol/L (ref 135–145)
Total Bilirubin: 0.4 mg/dL (ref 0.3–1.2)
Total Protein: 7.6 g/dL (ref 6.5–8.1)

## 2022-10-28 LAB — URINALYSIS, ROUTINE W REFLEX MICROSCOPIC
Bacteria, UA: NONE SEEN
Bilirubin Urine: NEGATIVE
Glucose, UA: NEGATIVE mg/dL
Ketones, ur: 5 mg/dL — AB
Leukocytes,Ua: NEGATIVE
Nitrite: NEGATIVE
Protein, ur: 30 mg/dL — AB
Specific Gravity, Urine: 1.032 — ABNORMAL HIGH (ref 1.005–1.030)
pH: 5 (ref 5.0–8.0)

## 2022-10-28 LAB — LIPASE, BLOOD: Lipase: 44 U/L (ref 11–51)

## 2022-10-28 LAB — TYPE AND SCREEN
ABO/RH(D): O NEG
Antibody Screen: NEGATIVE

## 2022-10-28 LAB — POC URINE PREG, ED: Preg Test, Ur: NEGATIVE

## 2022-10-28 NOTE — ED Notes (Signed)
This RN called pt cell phone. Pt advised she had already left and couldn't wait any longer.

## 2022-10-28 NOTE — ED Provider Notes (Signed)
Patient had been moved to a room on [facility] but eloped prior to my assessment.   Shaune Pollack, MD 10/28/22 [phone]

## 2022-10-28 NOTE — ED Notes (Signed)
Pt to first nurse desk asking how much longer. Pt advised she is next to be called.

## 2022-10-31 ENCOUNTER — Ambulatory Visit
Admission: EM | Admit: 2022-10-31 | Discharge: 2022-10-31 | Disposition: A | Discharge status: Home / Self Care | Payer: Medicaid Other | Attending: Emergency Medicine | Admitting: Emergency Medicine

## 2022-10-31 ENCOUNTER — Other Ambulatory Visit: Payer: Self-pay

## 2022-10-31 DIAGNOSIS — B3731 Acute candidiasis of vulva and vagina: Secondary | ICD-10-CM | POA: Diagnosis present

## 2022-10-31 DIAGNOSIS — R102 Pelvic and perineal pain: Secondary | ICD-10-CM | POA: Diagnosis present

## 2022-10-31 DIAGNOSIS — R1024 Suprapubic pain: Secondary | ICD-10-CM

## 2022-10-31 DIAGNOSIS — R030 Elevated blood-pressure reading, without diagnosis of hypertension: Secondary | ICD-10-CM

## 2022-10-31 DIAGNOSIS — N760 Acute vaginitis: Secondary | ICD-10-CM | POA: Insufficient documentation

## 2022-10-31 DIAGNOSIS — B9689 Other specified bacterial agents as the cause of diseases classified elsewhere: Secondary | ICD-10-CM | POA: Diagnosis present

## 2022-10-31 DIAGNOSIS — F419 Anxiety disorder, unspecified: Secondary | ICD-10-CM

## 2022-10-31 DIAGNOSIS — O009 Unspecified ectopic pregnancy without intrauterine pregnancy: Secondary | ICD-10-CM

## 2022-10-31 HISTORY — DX: Unspecified asthma, uncomplicated: J45.909

## 2022-10-31 LAB — WET PREP, GENITAL
Sperm: NONE SEEN
Trich, Wet Prep: NONE SEEN
WBC, Wet Prep HPF POC: <10 (ref <10)

## 2022-10-31 LAB — URINALYSIS, W/ REFLEX TO CULTURE (INFECTION SUSPECTED)
Bacteria, UA: NONE SEEN
Bilirubin Urine: NEGATIVE
Glucose, UA: NEGATIVE mg/dL
Hgb urine dipstick: NEGATIVE
Ketones, ur: NEGATIVE mg/dL
Leukocytes,Ua: NEGATIVE
Nitrite: NEGATIVE
Protein, ur: NEGATIVE mg/dL
RBC / HPF: NONE SEEN RBC/hpf (ref 0–5)
Specific Gravity, Urine: 1.015 (ref 1.005–1.030)
pH: 7 (ref 5.0–8.0)

## 2022-10-31 LAB — PREGNANCY, URINE: Preg Test, Ur: NEGATIVE

## 2022-10-31 MED ORDER — METRONIDAZOLE 500 MG PO TABS: 500.0000 mg | ORAL_TABLET | Freq: Two times a day (BID) | ORAL | 0 refills | Status: DC

## 2022-10-31 MED ORDER — FLUCONAZOLE 150 MG PO TABS: ORAL_TABLET | ORAL | 0 refills | Status: AC

## 2022-10-31 MED ORDER — METRONIDAZOLE 500 MG PO TABS: 500.0000 mg | ORAL_TABLET | Freq: Two times a day (BID) | ORAL | 0 refills | Status: AC

## 2022-10-31 MED ORDER — FLUCONAZOLE 150 MG PO TABS: ORAL_TABLET | ORAL | 0 refills | Status: DC

## 2022-10-31 NOTE — Discharge Instructions (Addendum)
We are treating you for BV and yeast infection, take meds as directed,avoid alcohol use while taking flagyl. Drink plenty of water. Your pregnancy test and urine was negative for infection.   Please follow up with ob/gyn of your choice([facility] information given).  Check my chart for results. Follow up with PCP. Avoid sexual activity until results, any treatment needed and completed. Safe sex with all future sexual activity. Return as needed.  If you have worsening symptoms go to ER for further evaluation.

## 2022-10-31 NOTE — ED Provider Notes (Signed)
[facility]    CSN: [id number] Arrival date & time: 10/31/22  1130      History   Chief Complaint Chief Complaint  Patient presents with   Possible Pregnancy   Abdominal Pain    HPI Heather [name] is a 32 y.o. female.   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 Pt states she feels weird, took Plan B after new sexual partner and started period sooner than normal, went to ER but left before results. Pt states she had unprotected sex with new partner. Pt reports suprapubic pain, vaginal odor and menstrual cramps.  PMH of ectopic pregnancy and anxiety  The history is provided by the patient. No language interpreter was used.    Past Medical History:  Diagnosis Date   Anxiety    Asthma    Ectopic pregnancy     Patient Active Problem List   Diagnosis Date Noted   Acute suprapubic pain 10/31/2022   Yeast vaginitis 10/31/2022   BV (bacterial vaginosis) 10/31/2022   Elevated blood pressure reading 10/31/2022   Ectopic pregnancy    Anxiety     Past Surgical History:  Procedure Laterality Date   CESAREAN SECTION  2012   ECTOPIC PREGNANCY SURGERY      OB History     Gravida  4   Para  1   Term  1   Preterm      AB  3   Living  1      SAB      IAB  2   Ectopic  1   Multiple      Live Births  1            Home Medications    Prior to Admission medications   Medication Sig Start Date End Date Taking? Authorizing Provider  albuterol (PROVENTIL HFA;VENTOLIN HFA) 108 (90 Base) MCG/ACT inhaler Inhale 2 puffs into the lungs every 4 (four) hours as needed for wheezing or shortness of breath. 06/23/16   Hassan Rowan, MD  fluconazole (DIFLUCAN) 150 MG tablet Take 1 tab by mouth today, repeat on days 3 and days 7 10/31/22   Brasen Bundren, Para March, NP  metroNIDAZOLE (FLAGYL) 500 MG tablet Take 1 tablet (500 mg total) by mouth 2 (two) times daily for 7 days. 10/31/22 11/07/22  Shyan Scalisi, Para March,  NP    Family History Family History  Problem Relation Age of Onset   Ovarian cancer Neg Hx    Breast cancer Neg Hx    Diabetes Neg Hx     Social History Social History   Tobacco Use   Smoking status: Every Day    Types: Cigars   Smokeless tobacco: Never  Vaping Use   Vaping status: Never Used  Substance Use Topics   Alcohol use: Yes    Comment: social   Drug use: Yes    Types: Marijuana     Allergies   Patient has no known allergies.   Review of Systems Review of Systems  Constitutional:  Negative for fever.  Gastrointestinal:  Positive for abdominal pain.  Genitourinary:  Positive for menstrual problem and vaginal bleeding.       Vaginal odor  All other systems reviewed and are negative.    Physical Exam Triage Vital Signs ED Triage Vitals  Encounter Vitals Group     BP      Systolic BP Percentile      Diastolic BP Percentile  Pulse      Resp      Temp      Temp src      SpO2      Weight      Height      Head Circumference      Peak Flow      Pain Score      Pain Loc      Pain Education      Exclude from Growth Chart    No data found.  Updated Vital Signs BP (!) 126/90   Pulse 94   Temp 98.7 F (37.1 C)   Resp 18   LMP 10/27/2022   SpO2 99%   Visual Acuity Right Eye Distance:   Left Eye Distance:   Bilateral Distance:    Right Eye Near:   Left Eye Near:    Bilateral Near:     Physical Exam Vitals and nursing note reviewed.  Constitutional:      General: She is not in acute distress.    Appearance: She is well-developed.  HENT:     Head: Normocephalic and atraumatic.  Eyes:     Conjunctiva/sclera: Conjunctivae normal.  Cardiovascular:     Rate and Rhythm: Normal rate and regular rhythm.     Heart sounds: No murmur heard. Pulmonary:     Effort: Pulmonary effort is normal. No respiratory distress.     Breath sounds: Normal breath sounds.  Abdominal:     Palpations: Abdomen is soft.     Tenderness: There is no  abdominal tenderness.  Musculoskeletal:        General: No swelling.     Cervical back: Neck supple.  Skin:    General: Skin is warm and dry.     Capillary Refill: Capillary refill takes less than 2 seconds.  Neurological:     General: No focal deficit present.     Mental Status: She is alert and oriented to person, place, and time.     GCS: GCS eye subscore is 4. GCS verbal subscore is 5. GCS motor subscore is 6.  Psychiatric:        Attention and Perception: Attention normal.        Mood and Affect: Mood normal.        Speech: Speech normal.        Behavior: Behavior normal.      UC Treatments / Results  Labs (all labs ordered are listed, but only abnormal results are displayed) Labs Reviewed  WET PREP, GENITAL - Abnormal; Notable for the following components:      Result Value   Yeast Wet Prep HPF POC PRESENT (*)    Clue Cells Wet Prep HPF POC PRESENT (*)    All other components within normal limits  URINALYSIS, W/ REFLEX TO CULTURE (INFECTION SUSPECTED) - Abnormal; Notable for the following components:   Color, Urine STRAW (*)    All other components within normal limits  PREGNANCY, URINE  CERVICOVAGINAL ANCILLARY ONLY    EKG   Radiology No results found.  Procedures Procedures (including critical care time)  Medications Ordered in UC Medications - No data to display  Initial Impression / Assessment and Plan / UC Course  I have reviewed the triage vital signs and the nursing notes.  Pertinent labs & imaging results that were available during my care of the patient were reviewed by me and considered in my medical decision making (see chart for details).  Clinical Course as of 10/31/22 2100  Tue Oct 31, 2022  1217 Preg test negative [JD]    Clinical Course User Index [JD] Maren Wiesen, Para March, NP   Pt aware of BV,yeast infection, pt wants to leave, discussed need for antibiotics, awaiting UA,pt states she will wait. Pt left with verbal instructions, scripts  sent.   Ddx: BV,Yeast infection, STI Final Clinical Impressions(s) / UC Diagnoses   Final diagnoses:  Acute suprapubic pain  Yeast vaginitis  BV (bacterial vaginosis)  Elevated blood pressure reading     Discharge Instructions      We are treating you for BV and yeast infection, take meds as directed,avoid alcohol use while taking flagyl. Drink plenty of water. Your pregnancy test and urine was negative for infection.   Please follow up with ob/gyn of your choice([facility] given).  Check my chart for results. Follow up with PCP. Avoid sexual activity until results, any treatment needed and completed. Safe sex with all future sexual activity. Return as needed.  If you have worsening symptoms go to ER for further evaluation.     ED Prescriptions     Medication Sig Dispense Auth. Provider   fluconazole (DIFLUCAN) 150 MG tablet  (Status: Discontinued) Take 1 tab by mouth today, repeat on days 3 and days 7 3 tablet Chasten Blaze, NP   metroNIDAZOLE (FLAGYL) 500 MG tablet  (Status: Discontinued) Take 1 tablet (500 mg total) by mouth 2 (two) times daily for 7 days. 14 tablet Disney Ruggiero, NP   fluconazole (DIFLUCAN) 150 MG tablet Take 1 tab by mouth today, repeat on days 3 and days 7 3 tablet Dannie Woolen, NP   metroNIDAZOLE (FLAGYL) 500 MG tablet Take 1 tablet (500 mg total) by mouth 2 (two) times daily for 7 days. 14 tablet Gabrelle Roca, Para March, NP      PDMP not reviewed this encounter.   Clancy Gourd, NP 10/31/22 2100

## 2022-10-31 NOTE — ED Triage Notes (Addendum)
Pt has hx of ectopic pregnancy and is concerned that she is pregnancy. Went to [facility] ER a couple of days ago and took blood and urine pt left before receiving results. States she is feeling weird. Also concerned about STD states she has a new sexual partner and wants to be tested. Pt experiencing abnormal bleeding, vaginal ordor.

## 2023-04-11 ENCOUNTER — Other Ambulatory Visit: Payer: Self-pay

## 2023-04-11 ENCOUNTER — Emergency Department
Admission: EM | Admit: 2023-04-11 | Discharge: 2023-04-11 | Disposition: A | Discharge status: Home / Self Care | Payer: Medicaid Other | Attending: Emergency Medicine | Admitting: Emergency Medicine

## 2023-04-11 ENCOUNTER — Emergency Department: Payer: Medicaid Other

## 2023-04-11 DIAGNOSIS — R102 Pelvic and perineal pain: Secondary | ICD-10-CM

## 2023-04-11 DIAGNOSIS — D509 Iron deficiency anemia, unspecified: Secondary | ICD-10-CM | POA: Diagnosis not present

## 2023-04-11 DIAGNOSIS — R109 Unspecified abdominal pain: Secondary | ICD-10-CM | POA: Diagnosis present

## 2023-04-11 DIAGNOSIS — D259 Leiomyoma of uterus, unspecified: Secondary | ICD-10-CM | POA: Insufficient documentation

## 2023-04-11 LAB — COMPREHENSIVE METABOLIC PANEL
ALT: 13 U/L (ref 0–44)
AST: 19 U/L (ref 15–41)
Albumin: 4.2 g/dL (ref 3.5–5.0)
Alkaline Phosphatase: 56 U/L (ref 38–126)
Anion gap: 10 (ref 5–15)
BUN: 8 mg/dL (ref 6–20)
CO2: 23 mmol/L (ref 22–32)
Calcium: 9.1 mg/dL (ref 8.9–10.3)
Chloride: 101 mmol/L (ref 98–111)
Creatinine, Ser: 0.9 mg/dL (ref 0.44–1.00)
GFR, Estimated: >60 mL/min (ref >60)
Glucose, Bld: 96 mg/dL (ref 70–99)
Potassium: 3.5 mmol/L (ref 3.5–5.1)
Sodium: 134 mmol/L — ABNORMAL LOW (ref 135–145)
Total Bilirubin: 0.4 mg/dL (ref 0.0–1.2)
Total Protein: 7.3 g/dL (ref 6.5–8.1)

## 2023-04-11 LAB — URINALYSIS, ROUTINE W REFLEX MICROSCOPIC
Bilirubin Urine: NEGATIVE
Glucose, UA: NEGATIVE mg/dL
Ketones, ur: NEGATIVE mg/dL
Leukocytes,Ua: NEGATIVE
Nitrite: NEGATIVE
Protein, ur: 30 mg/dL — AB
RBC / HPF: >50 RBC/hpf (ref 0–5)
Specific Gravity, Urine: 1.024 (ref 1.005–1.030)
pH: 5 (ref 5.0–8.0)

## 2023-04-11 LAB — CBC
HCT: 29.7 % — ABNORMAL LOW (ref 36.0–46.0)
Hemoglobin: 8.8 g/dL — ABNORMAL LOW (ref 12.0–15.0)
MCH: 20 pg — ABNORMAL LOW (ref 26.0–34.0)
MCHC: 29.6 g/dL — ABNORMAL LOW (ref 30.0–36.0)
MCV: 67.7 fL — ABNORMAL LOW (ref 80.0–100.0)
Platelets: 217 10*3/uL (ref 150–400)
RBC: 4.39 MIL/uL (ref 3.87–5.11)
RDW: 21.4 % — ABNORMAL HIGH (ref 11.5–15.5)
WBC: 5.1 10*3/uL (ref 4.0–10.5)
nRBC: 0 % (ref 0.0–0.2)

## 2023-04-11 LAB — POC URINE PREG, ED: Preg Test, Ur: NEGATIVE

## 2023-04-11 LAB — WET PREP, GENITAL
Clue Cells Wet Prep HPF POC: NONE SEEN
Sperm: NONE SEEN
Trich, Wet Prep: NONE SEEN
WBC, Wet Prep HPF POC: <10 (ref <10)
Yeast Wet Prep HPF POC: NONE SEEN

## 2023-04-11 LAB — CHLAMYDIA/NGC RT PCR (ARMC ONLY)
Chlamydia Tr: NOT DETECTED
N gonorrhoeae: NOT DETECTED

## 2023-04-11 LAB — LIPASE, BLOOD: Lipase: 58 U/L — ABNORMAL HIGH (ref 11–51)

## 2023-04-11 MED ORDER — IOHEXOL 300 MG/ML  SOLN
100.0000 mL | Freq: Once | INTRAMUSCULAR | Status: AC | PRN
  Administered 2023-04-11: 100 mL via INTRAVENOUS

## 2023-04-11 MED ORDER — ACETAMINOPHEN 500 MG PO TABS
1000.0000 mg | ORAL_TABLET | Freq: Once | ORAL | Status: AC
  Administered 2023-04-11: 1000 mg via ORAL
  Filled 2023-04-11: qty 2

## 2023-04-11 MED ORDER — KETOROLAC TROMETHAMINE 15 MG/ML IJ SOLN
15.0000 mg | Freq: Once | INTRAMUSCULAR | Status: AC
  Administered 2023-04-11: 15 mg via INTRAVENOUS
  Filled 2023-04-11: qty 1

## 2023-04-11 NOTE — ED Notes (Signed)
 Pt declined DISPO vitals

## 2023-04-11 NOTE — ED Notes (Signed)
 Pt to CT

## 2023-04-11 NOTE — Discharge Instructions (Addendum)
 You are seen in the emergency department for lower abdominal pain.  You had an ultrasound done that did not show any signs of an ovarian cyst or torsion.  You did have findings concerning for fibroids in your uterus.  Your lab work showed significant anemia.  Continue on your iron supplements.  You can take Motrin and Tylenol  for pain control.  Discussed close follow-up with your primary care physician.  Discussed following up with gynecology for further evaluation and treatment of your anemia and possible fibroids.  Return to the emergency department if you have any ongoing or worsening symptoms.  Thank you for choosing us  for your health care, it was my pleasure to care for you today!  Clotilda Punter, MD

## 2023-04-11 NOTE — ED Provider Notes (Signed)
 [facility] Provider Note    Event Date/Time   First MD Initiated Contact with Patient 04/11/23 [phone]     (approximate)   History   Abdominal Pain   HPI  Heather [name] is a 33 y.o. female past medical history significant for anemia who presents to the emergency department with lower abdominal pain.  Lower abdominal pain has been ongoing for the past 3 to 4 days.  Associated with some vaginal bleeding and believes that she is currently on her menstrual cycle.  Feels like something is shifting in her lower abdomen.  Evaluated 2 days ago and had swabs obtained.  States that she has no concern for an STI.  Does endorse a history of ectopic pregnancy.  No other prior abdominal surgeries.  Denies nausea, vomiting.  No dysuria, urinary urgency or frequency.  Denies any blood in her stool.  Denies any ibuprofen intake or Goody's powder.  Does states that she has been constipated and taking ferrous sulfate for known anemia.  Does not currently have a primary care physician.     Physical Exam   Triage Vital Signs: ED Triage Vitals  Encounter Vitals Group     BP 04/11/23 0254 125/65     Systolic BP Percentile --      Diastolic BP Percentile --      Pulse Rate 04/11/23 0254 90     Resp 04/11/23 0254 18     Temp 04/11/23 0254 98.6 F (37 C)     Temp Source 04/11/23 0254 Oral     SpO2 04/11/23 0254 100 %     Weight 04/11/23 0253 140 lb (63.5 kg)     Height 04/11/23 0253 5' 2 (1.575 m)     Head Circumference --      Peak Flow --      Pain Score 04/11/23 0253 3     Pain Loc --      Pain Education --      Exclude from Growth Chart --     Most recent vital signs: Vitals:   04/11/23 0716 04/11/23 0738  BP:    Pulse:    Resp:    Temp:  98.2 F (36.8 C)  SpO2: 100%     Physical Exam Exam conducted with a chaperone present.  Constitutional:      Appearance: She is well-developed.  HENT:     Head: Atraumatic.  Eyes:     Conjunctiva/sclera:  Conjunctivae normal.  Cardiovascular:     Rate and Rhythm: Regular rhythm.  Pulmonary:     Effort: No respiratory distress.  Abdominal:     General: There is no distension.     Tenderness: There is abdominal tenderness in the suprapubic area.  Genitourinary:    Vagina: Bleeding present.     Cervix: Normal. No cervical motion tenderness, discharge or friability.     Adnexa:        Right: No tenderness.         Left: No tenderness.    Musculoskeletal:        General: Normal range of motion.     Cervical back: Normal range of motion.  Skin:    General: Skin is warm.     Capillary Refill: Capillary refill takes less than 2 seconds.  Neurological:     Mental Status: She is alert. Mental status is at baseline.     IMPRESSION / MDM / ASSESSMENT AND PLAN / ED COURSE  I reviewed the triage vital  signs and the nursing notes.  On chart review patient had a UA obtained 2 days ago that was positive for leukocyte esterase and RBCs.  Urine was sent for culture.  Negative bacterial vaginosis.  Differential diagnosis including anemia, ovarian cyst, constipation, IBD, IBS, urinary tract infection.  Pregnancy test is negative RADIOLOGY I independently reviewed imaging, my interpretation of imaging: CT abdomen and pelvis -no findings of acute appendicitis.  No obvious ovarian masses.  Trace free fluid that was felt to be likely physiologic.  Transvaginal ultrasound with no signs of torsion.  Trace physiologic fluid.  Did note findings concerning for leiomyomas  LABS (all labs ordered are listed, but only abnormal results are displayed) Labs interpreted as -    Labs Reviewed  LIPASE, BLOOD - Abnormal; Notable for the following components:      Result Value   Lipase 58 (*)    All other components within normal limits  COMPREHENSIVE METABOLIC PANEL - Abnormal; Notable for the following components:   Sodium 134 (*)    All other components within normal limits  CBC - Abnormal; Notable for the  following components:   Hemoglobin 8.8 (*)    HCT 29.7 (*)    MCV 67.7 (*)    MCH 20.0 (*)    MCHC 29.6 (*)    RDW 21.4 (*)    All other components within normal limits  URINALYSIS, ROUTINE W REFLEX MICROSCOPIC - Abnormal; Notable for the following components:   Color, Urine YELLOW (*)    APPearance CLEAR (*)    Hgb urine dipstick MODERATE (*)    Protein, ur 30 (*)    Bacteria, UA RARE (*)    All other components within normal limits  CHLAMYDIA/NGC RT PCR ([facility] ONLY)            WET PREP, GENITAL  POC URINE PREG, ED     MDM  Mildly elevated lipase.  Anemia with a hemoglobin level of 8.8 that appears microcytic.  UA with large blood but no signs of urinary tract infection.  Pregnancy test is negative.  GC/chlamydia and wet prep being obtained.  Endorses ongoing left lower abdominal pain.  Will do ultrasound to further evaluate for possible torsion or TOA.  Ultrasound with concerns for leiomyoma but no other findings concerning for ovarian torsion or an obvious ruptured cyst.  Patient's pain is well-controlled.  Discussed close follow-up with primary care physician and obstetrician.  Patient is currently on ferrous sulfate.  Discussed taking this on a daily basis.  Discussed return precautions for any ongoing or worsening symptoms or to return for any symptoms of anemia.     PROCEDURES:  Critical Care performed: No  Procedures  Patient's presentation is most consistent with acute illness / injury with system symptoms.   MEDICATIONS ORDERED IN ED: Medications  ketorolac  (TORADOL ) 15 MG/ML injection 15 mg (15 mg Intravenous Given 04/11/23 0741)  iohexol  (OMNIPAQUE ) 300 MG/ML solution 100 mL (100 mLs Intravenous Contrast Given 04/11/23 0804)  acetaminophen  (TYLENOL ) tablet 1,000 mg (1,000 mg Oral Given 04/11/23 0932)    FINAL CLINICAL IMPRESSION(S) / ED DIAGNOSES   Final diagnoses:  Pelvic pain  Leiomyoma of body of uterus  Iron deficiency anemia, unspecified iron deficiency  anemia type     Rx / DC Orders   ED Discharge Orders          Ordered    Ambulatory Referral to Primary Care (Establish Care)        04/11/23 1121  Note:  This document was prepared using Dragon voice recognition software and may include unintentional dictation errors.   Suzanne Kirsch, MD 04/11/23 1121

## 2023-04-11 NOTE — ED Notes (Signed)
 Pt ambulatory to STAT desk without diff or distress noted to inquire over wait time; pt voices concerns that people have gone before her and she is still waiting; explained to pt purpose of triage and acuity levels; pt updated on plan of care and informed that she is awaiting an exam room to be evaluated further; pt then st you have a condescending tone; pt then st she is going to her car to charge her phone; pt informed that this nurse would call her on her cell phone in case they called her to a room before she came back inside

## 2023-04-11 NOTE — ED Triage Notes (Signed)
 Pt reports lower abd pain x few days and some vaginal bleeding. Pt states she was constipated a few days ago and was able to have a BM. Pt had some relief from pain with enema.

## 2023-04-30 ENCOUNTER — Ambulatory Visit: Payer: 59 | Admitting: Nurse Practitioner

## 2023-05-04 ENCOUNTER — Other Ambulatory Visit: Payer: Self-pay

## 2023-05-04 DIAGNOSIS — R55 Syncope and collapse: Secondary | ICD-10-CM | POA: Insufficient documentation

## 2023-05-04 DIAGNOSIS — S0990XA Unspecified injury of head, initial encounter: Secondary | ICD-10-CM | POA: Diagnosis present

## 2023-05-04 DIAGNOSIS — W228XXA Striking against or struck by other objects, initial encounter: Secondary | ICD-10-CM | POA: Insufficient documentation

## 2023-05-04 DIAGNOSIS — Z5321 Procedure and treatment not carried out due to patient leaving prior to being seen by health care provider: Secondary | ICD-10-CM | POA: Insufficient documentation

## 2023-05-04 LAB — CBG MONITORING, ED: Glucose-Capillary: 146 mg/dL — ABNORMAL HIGH (ref 70–99)

## 2023-05-04 NOTE — ED Triage Notes (Signed)
Patient arrives via EMS C/O syncopal episode x2. Patient states this has happened before, but she never saw a Dr. To be evaluated. Denies any cardiac or neuro history at this time. Patient states she did hit her head. Denies blood thinners.

## 2023-05-05 ENCOUNTER — Emergency Department
Admission: EM | Admit: 2023-05-05 | Discharge: 2023-05-05 | Discharge status: Against Medical Advice | Payer: Medicaid Other | Attending: Emergency Medicine | Admitting: Emergency Medicine

## 2023-05-05 LAB — CBC WITH DIFFERENTIAL/PLATELET
Abs Immature Granulocytes: 0.01 10*3/uL (ref 0.00–0.07)
Basophils Absolute: 0 10*3/uL (ref 0.0–0.1)
Basophils Relative: 0 %
Eosinophils Absolute: 0 10*3/uL (ref 0.0–0.5)
Eosinophils Relative: 0 %
HCT: 33.1 % — ABNORMAL LOW (ref 36.0–46.0)
Hemoglobin: 10 g/dL — ABNORMAL LOW (ref 12.0–15.0)
Immature Granulocytes: 0 %
Lymphocytes Relative: 32 %
Lymphs Abs: 1.6 10*3/uL (ref 0.7–4.0)
MCH: 19.7 pg — ABNORMAL LOW (ref 26.0–34.0)
MCHC: 30.2 g/dL (ref 30.0–36.0)
MCV: 65.2 fL — ABNORMAL LOW (ref 80.0–100.0)
Monocytes Absolute: 0.4 10*3/uL (ref 0.1–1.0)
Monocytes Relative: 7 %
Neutro Abs: 2.9 10*3/uL (ref 1.7–7.7)
Neutrophils Relative %: 61 %
Platelets: 142 10*3/uL — ABNORMAL LOW (ref 150–400)
RBC: 5.08 MIL/uL (ref 3.87–5.11)
RDW: 19.6 % — ABNORMAL HIGH (ref 11.5–15.5)
WBC: 4.9 10*3/uL (ref 4.0–10.5)
nRBC: 0 % (ref 0.0–0.2)

## 2023-05-05 LAB — BASIC METABOLIC PANEL
Anion gap: 12 (ref 5–15)
BUN: 6 mg/dL (ref 6–20)
CO2: 21 mmol/L — ABNORMAL LOW (ref 22–32)
Calcium: 9.7 mg/dL (ref 8.9–10.3)
Chloride: 103 mmol/L (ref 98–111)
Creatinine, Ser: 0.89 mg/dL (ref 0.44–1.00)
GFR, Estimated: >60 mL/min (ref >60)
Glucose, Bld: 130 mg/dL — ABNORMAL HIGH (ref 70–99)
Potassium: 3.4 mmol/L — ABNORMAL LOW (ref 3.5–5.1)
Sodium: 136 mmol/L (ref 135–145)

## 2023-05-05 NOTE — ED Notes (Signed)
Pt to desk asking about wait times and advising they was leaving. This RN educated and encouraged to stay. Pt left.
